# Patient Record
Sex: Female | Born: 1945 | Race: White | Hispanic: No | Smoking: Former smoker
Health system: Southern US, Community
[De-identification: ages and names within clinical notes are randomized; demographics above are authoritative.]

## PROBLEM LIST (undated history)

## (undated) ENCOUNTER — Emergency Department (HOSPITAL_COMMUNITY): Payer: PPO

## (undated) DIAGNOSIS — M1990 Unspecified osteoarthritis, unspecified site: Secondary | ICD-10-CM

## (undated) DIAGNOSIS — E039 Hypothyroidism, unspecified: Secondary | ICD-10-CM

## (undated) DIAGNOSIS — N890 Mild vaginal dysplasia: Secondary | ICD-10-CM

## (undated) DIAGNOSIS — IMO0002 Reserved for concepts with insufficient information to code with codable children: Secondary | ICD-10-CM

## (undated) DIAGNOSIS — F32A Depression, unspecified: Secondary | ICD-10-CM

## (undated) DIAGNOSIS — N8110 Cystocele, unspecified: Secondary | ICD-10-CM

## (undated) DIAGNOSIS — F319 Bipolar disorder, unspecified: Secondary | ICD-10-CM

## (undated) DIAGNOSIS — F419 Anxiety disorder, unspecified: Secondary | ICD-10-CM

## (undated) DIAGNOSIS — M419 Scoliosis, unspecified: Secondary | ICD-10-CM

## (undated) DIAGNOSIS — E785 Hyperlipidemia, unspecified: Secondary | ICD-10-CM

## (undated) DIAGNOSIS — N938 Other specified abnormal uterine and vaginal bleeding: Secondary | ICD-10-CM

## (undated) DIAGNOSIS — F329 Major depressive disorder, single episode, unspecified: Secondary | ICD-10-CM

## (undated) HISTORY — DX: Scoliosis, unspecified: M41.9

## (undated) HISTORY — PX: LASER ABLATION OF THE CERVIX: SHX1949

## (undated) HISTORY — DX: Reserved for concepts with insufficient information to code with codable children: IMO0002

## (undated) HISTORY — DX: Bipolar disorder, unspecified: F31.9

## (undated) HISTORY — PX: COLPOSCOPY: SHX161

## (undated) HISTORY — PX: ANTERIOR AND POSTERIOR VAGINAL REPAIR: SUR5

## (undated) HISTORY — DX: Mild vaginal dysplasia: N89.0

## (undated) HISTORY — DX: Other specified abnormal uterine and vaginal bleeding: N93.8

---

## 1979-01-30 HISTORY — PX: TUBAL LIGATION: SHX77

## 1990-01-29 HISTORY — PX: ABDOMINAL HYSTERECTOMY: SHX81

## 1999-03-23 ENCOUNTER — Inpatient Hospital Stay (HOSPITAL_COMMUNITY): Admission: AD | Admit: 1999-03-23 | Discharge: 1999-03-28 | Payer: Self-pay | Admitting: *Deleted

## 2000-01-15 ENCOUNTER — Inpatient Hospital Stay (HOSPITAL_COMMUNITY): Admission: EM | Admit: 2000-01-15 | Discharge: 2000-01-17 | Payer: Self-pay

## 2000-01-15 ENCOUNTER — Encounter: Payer: Self-pay | Admitting: Orthopedic Surgery

## 2000-01-15 HISTORY — PX: OTHER SURGICAL HISTORY: SHX169

## 2000-04-09 ENCOUNTER — Encounter: Payer: Self-pay | Admitting: Orthopedic Surgery

## 2000-04-09 ENCOUNTER — Encounter: Admission: RE | Admit: 2000-04-09 | Discharge: 2000-04-09 | Payer: Self-pay | Admitting: Orthopedic Surgery

## 2001-11-19 ENCOUNTER — Ambulatory Visit (HOSPITAL_COMMUNITY): Admission: RE | Admit: 2001-11-19 | Discharge: 2001-11-19 | Payer: Self-pay | Admitting: Internal Medicine

## 2001-11-19 ENCOUNTER — Encounter: Payer: Self-pay | Admitting: Internal Medicine

## 2002-01-28 ENCOUNTER — Other Ambulatory Visit: Admission: RE | Admit: 2002-01-28 | Discharge: 2002-01-28 | Payer: Self-pay | Admitting: Obstetrics and Gynecology

## 2002-05-20 ENCOUNTER — Encounter: Payer: Self-pay | Admitting: Obstetrics and Gynecology

## 2002-05-22 ENCOUNTER — Inpatient Hospital Stay (HOSPITAL_COMMUNITY): Admission: RE | Admit: 2002-05-22 | Discharge: 2002-05-24 | Payer: Self-pay | Admitting: Obstetrics and Gynecology

## 2003-06-20 ENCOUNTER — Emergency Department (HOSPITAL_COMMUNITY): Admission: EM | Admit: 2003-06-20 | Discharge: 2003-06-20 | Payer: Self-pay | Admitting: Emergency Medicine

## 2003-08-25 ENCOUNTER — Other Ambulatory Visit: Admission: RE | Admit: 2003-08-25 | Discharge: 2003-08-25 | Payer: Self-pay | Admitting: Obstetrics and Gynecology

## 2004-11-16 ENCOUNTER — Other Ambulatory Visit: Admission: RE | Admit: 2004-11-16 | Discharge: 2004-11-16 | Payer: Self-pay | Admitting: Obstetrics and Gynecology

## 2006-01-15 ENCOUNTER — Other Ambulatory Visit: Admission: RE | Admit: 2006-01-15 | Discharge: 2006-01-15 | Payer: Self-pay | Admitting: Obstetrics and Gynecology

## 2007-02-13 ENCOUNTER — Other Ambulatory Visit: Admission: RE | Admit: 2007-02-13 | Discharge: 2007-02-13 | Payer: Self-pay | Admitting: Obstetrics and Gynecology

## 2007-02-16 ENCOUNTER — Emergency Department (HOSPITAL_COMMUNITY): Admission: EM | Admit: 2007-02-16 | Discharge: 2007-02-16 | Payer: Self-pay | Admitting: *Deleted

## 2007-02-16 IMAGING — CR DG SHOULDER 2+V*L*
4 series · 4 of 4 positions shown · non-contrast
Comparison: none

CLINICAL DATA: Status post fall, trauma, left shoulder pain.   
 LEFT SHOULDER - 4 VIEW:

[w shoulder ap internal left]
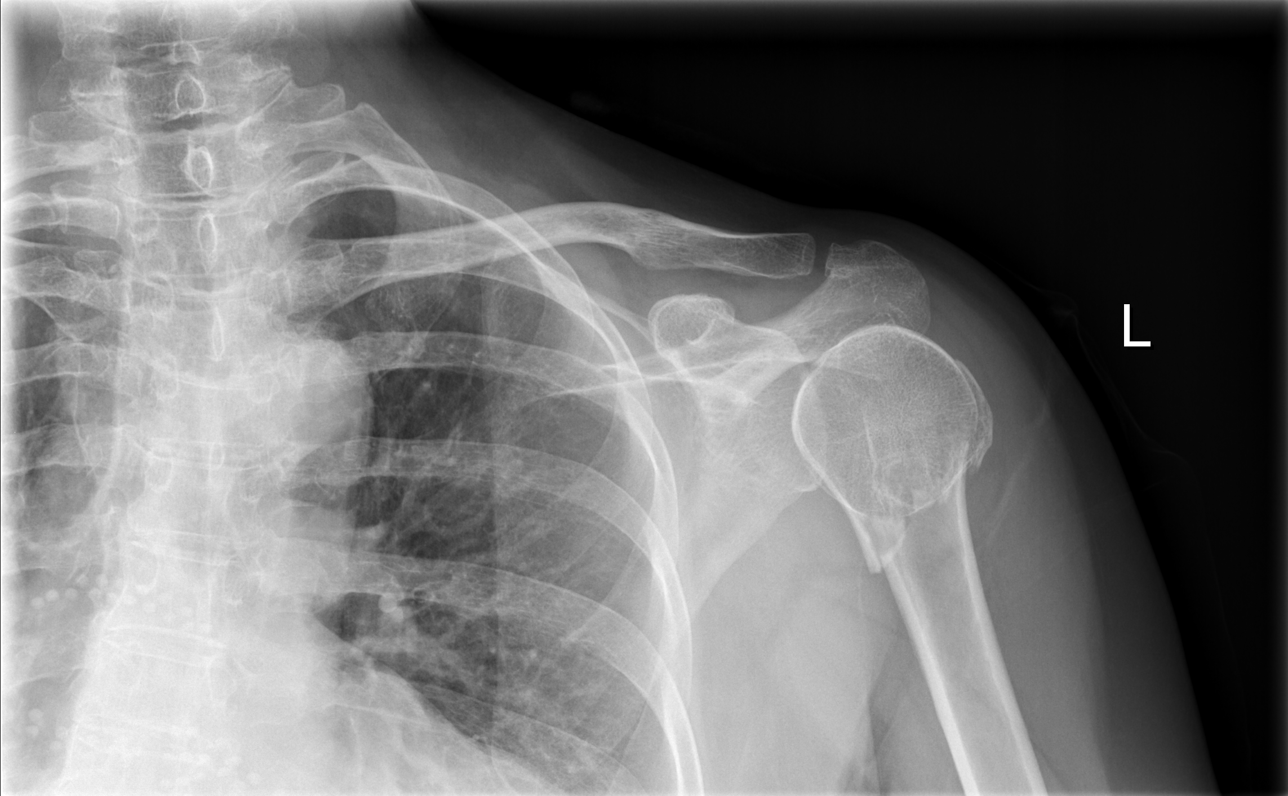

[w shoulder ap external left]
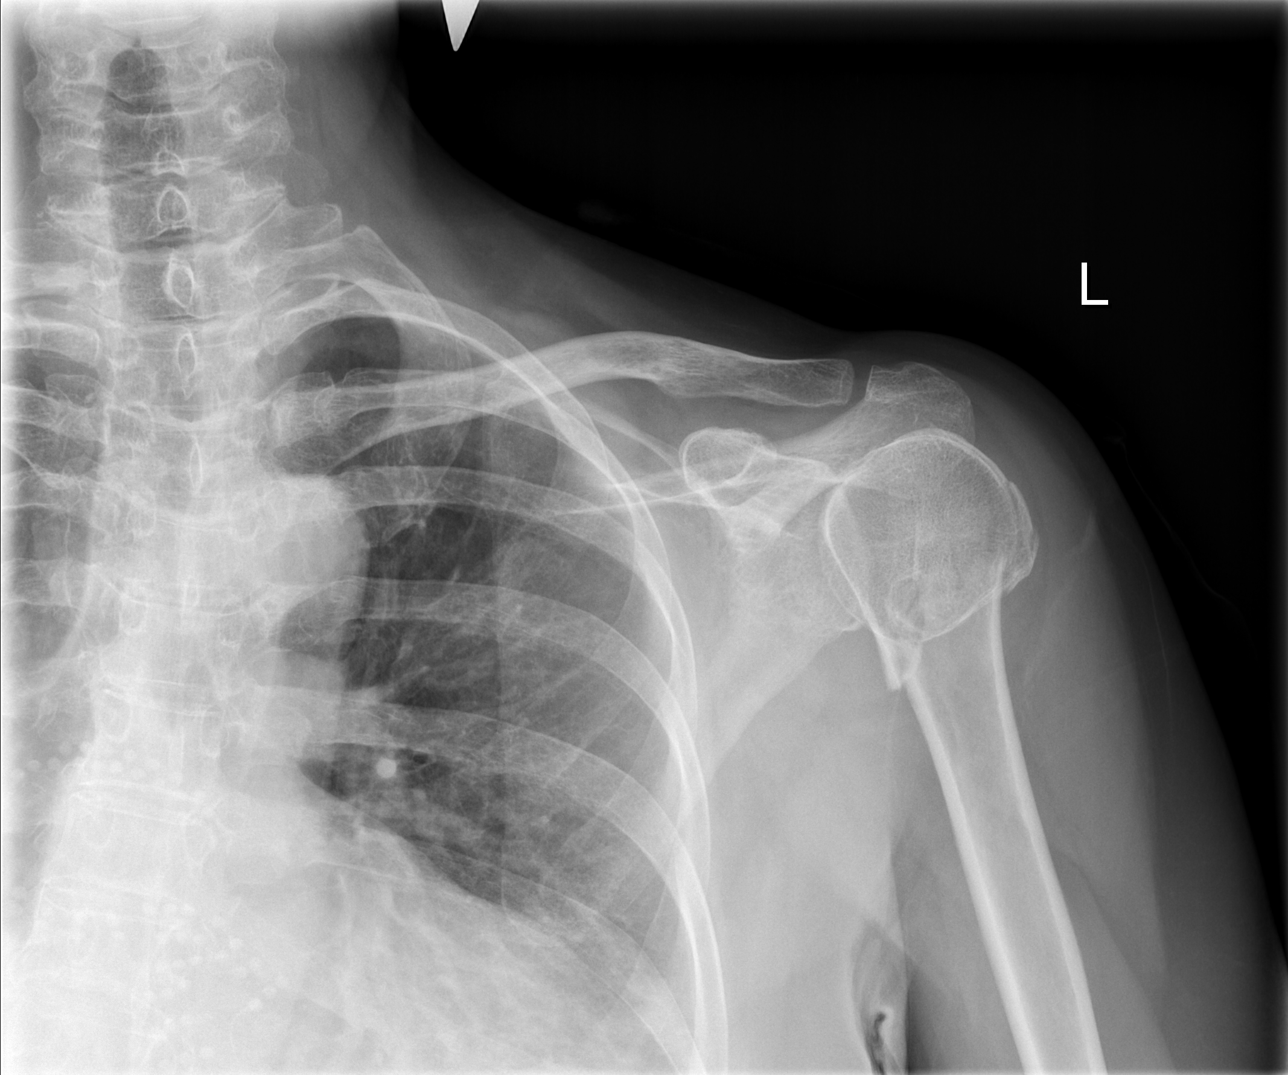

[w shoulder y view left * (1 of 2)]
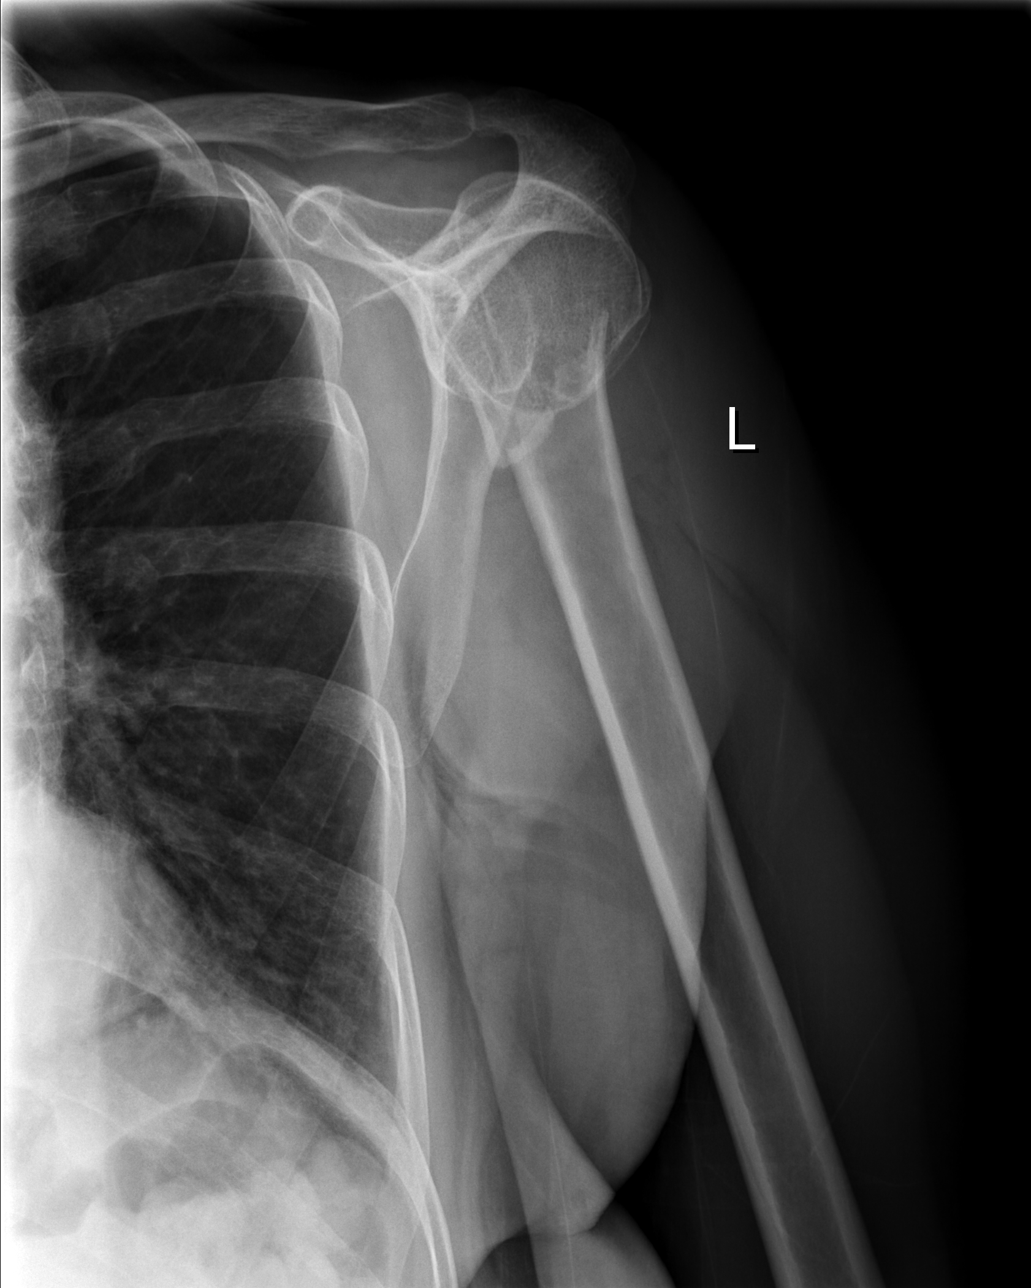

[w shoulder y view left * (2 of 2)]
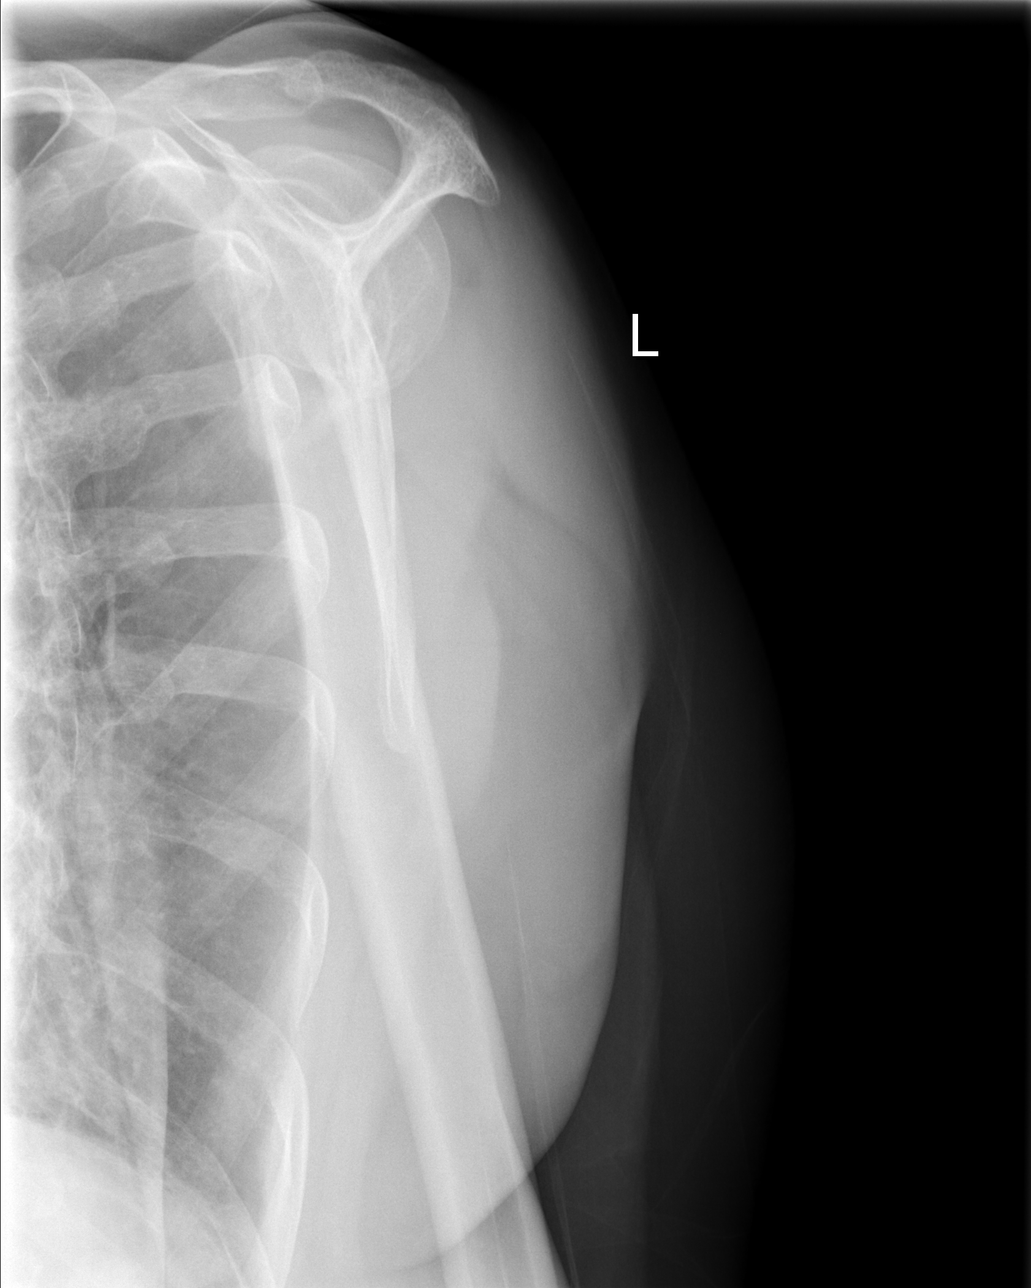

[4 of 4 positions shown; findings below may reference images not displayed]

FINDINGS: Four views of the left shoulder show mild displaced subcapitellar fracture of the left proximal humerus.
IMPRESSION: Mild displaced subcapitellar fracture of the proximal left humerus.

## 2007-04-18 ENCOUNTER — Ambulatory Visit (HOSPITAL_COMMUNITY): Admission: RE | Admit: 2007-04-18 | Discharge: 2007-04-18 | Payer: Self-pay | Admitting: Obstetrics and Gynecology

## 2007-09-09 ENCOUNTER — Ambulatory Visit (HOSPITAL_COMMUNITY): Admission: RE | Admit: 2007-09-09 | Discharge: 2007-09-09 | Payer: Self-pay | Admitting: *Deleted

## 2007-09-09 ENCOUNTER — Encounter (INDEPENDENT_AMBULATORY_CARE_PROVIDER_SITE_OTHER): Payer: Self-pay | Admitting: *Deleted

## 2007-09-09 HISTORY — PX: HEMORRHOID SURGERY: SHX153

## 2007-12-02 ENCOUNTER — Other Ambulatory Visit: Admission: RE | Admit: 2007-12-02 | Discharge: 2007-12-02 | Payer: Self-pay | Admitting: Obstetrics and Gynecology

## 2007-12-02 ENCOUNTER — Encounter: Payer: Self-pay | Admitting: Obstetrics and Gynecology

## 2007-12-02 ENCOUNTER — Ambulatory Visit: Payer: Self-pay | Admitting: Obstetrics and Gynecology

## 2008-02-17 ENCOUNTER — Ambulatory Visit: Payer: Self-pay | Admitting: Obstetrics and Gynecology

## 2008-02-17 ENCOUNTER — Other Ambulatory Visit: Admission: RE | Admit: 2008-02-17 | Discharge: 2008-02-17 | Payer: Self-pay | Admitting: Obstetrics and Gynecology

## 2008-02-17 ENCOUNTER — Encounter: Payer: Self-pay | Admitting: Obstetrics and Gynecology

## 2008-04-08 ENCOUNTER — Ambulatory Visit: Payer: Self-pay | Admitting: Obstetrics and Gynecology

## 2008-04-20 ENCOUNTER — Ambulatory Visit (HOSPITAL_COMMUNITY): Admission: RE | Admit: 2008-04-20 | Discharge: 2008-04-20 | Payer: Self-pay | Admitting: Obstetrics and Gynecology

## 2009-04-01 ENCOUNTER — Other Ambulatory Visit: Admission: RE | Admit: 2009-04-01 | Discharge: 2009-04-01 | Payer: Self-pay | Admitting: Obstetrics and Gynecology

## 2009-04-01 ENCOUNTER — Ambulatory Visit: Payer: Self-pay | Admitting: Obstetrics and Gynecology

## 2009-04-12 ENCOUNTER — Ambulatory Visit: Payer: Self-pay | Admitting: Obstetrics and Gynecology

## 2009-04-20 ENCOUNTER — Ambulatory Visit (HOSPITAL_COMMUNITY): Admission: RE | Admit: 2009-04-20 | Discharge: 2009-04-20 | Payer: Self-pay | Admitting: Obstetrics and Gynecology

## 2010-04-03 ENCOUNTER — Encounter: Payer: Self-pay | Admitting: Obstetrics and Gynecology

## 2010-06-13 NOTE — Op Note (Signed)
NAMECHRISTIONA, [name]              ACCOUNT NO.:  [id number]   MEDICAL RECORD NO.:  [id number]          PATIENT TYPE:  AMB   LOCATION:  DAY                          FACILITY:  [facility]   PHYSICIAN:  Alfonse Ras, MD   DATE OF BIRTH:  [birth year]   DATE OF PROCEDURE:  09/09/2007  DATE OF DISCHARGE:                               OPERATIVE REPORT   PREOPERATIVE DIAGNOSIS:  Grade II and III internal hemorrhoids.   POSTOPERATIVE DIAGNOSIS:  Grade II and III internal hemorrhoids.   PROCEDURE:  PPH hemorrhoidectomy, rectopexy.   ANESTHESIA:  General.   DESCRIPTION:  The patient was taken to the operating room, placed in the  supine position.  After adequate general anesthesia was induced using  endotracheal tube, placed in the prone jack-knife position.  Digital  dilatation was accomplished to 3 fingerbreadths in the anorectum.  The 3  hemorrhoidal bundles were injected with 0.5 Marcaine with Wydase.  This  was massaged in place.  A pursestring 2-0 Prolene suture was placed 5 cm  proximal to the dentate line involving the mucosa and submucosa  circumferentially.  Stapler was introduced, closed after the pursestring  suture was tied around the anvil.  The vagina was checked.  It was held  in place for 45 seconds and fired.  Staple line was inspected.  It was  exactly 2.5 cm from the dentate line, I had a good rim of hemorrhoidal  tissue.  There was no bleeding.  Gelfoam packing was placed.  Sterile  dressing was applied.  The patient tolerated the procedure well and went  to PACU in good condition.      Alfonse Ras, MD  Electronically Signed     KRE/MEDQ  D:  09/09/2007  T:  09/10/2007  Job:  [id number]

## 2010-06-16 NOTE — Op Note (Signed)
[name], [name]                          ACCOUNT NO.:  [id number]   MEDICAL RECORD NO.:  [id number]                   PATIENT TYPE:  INP   LOCATION:  X006                                 FACILITY:  [facility]   PHYSICIAN:  Daniel L. Eda Paschal, M.D.           DATE OF BIRTH:  1945-07-23   DATE OF PROCEDURE:  05/22/2002  DATE OF DISCHARGE:                                 OPERATIVE REPORT   PREOPERATIVE DIAGNOSIS:  Cystocele, rectocele, urinary stress incontinence.   POSTOPERATIVE DIAGNOSIS:  Cystocele, rectocele, urinary stress incontinence.   OPERATION:  Anterior and posterior repair, SPARC sling.   SURGEON:  Daniel L. Eda Paschal, M.D. and Maretta Bees. Vonita Moss, M.D.   FIRST ASSISTANT:  Timothy P. Fontaine, M.D.   ANESTHESIA:  General endotracheal.   FINDINGS:  At the time of surgery, the patient had a third degree cystocele  with complete loss of the urethrovesical angle. There was no enterocele  present. There was, however, a 1 to 1 1/2 degree rectocele.   DESCRIPTION OF PROCEDURE:  After adequate general endotracheal anesthesia,  the patient was placed in the dorsal lithotomy position, prepped and draped  in the usual sterile manner. The vaginal cuff was identified, an inverted T  incision was made. The perivesical fascia was sharply dissected free from  the anterior vaginal mucosa and this was done in such a fashion that the  cystocele was completely free now and was completely off the anterior  mucosa. At this point, Dr. Vonita Moss came and did a [facility] sling which was  dictated in a separate operative note. After he was finished, Dr. Eda Paschal  rescrubbed in and he closed the anterior wall by picking up excellent  vesical fascia and approximately 10 sutures completely reduced the cystocele  and brought the fascia together in the midline. The suture material was used  was 2-0 Vicryl. Redundant vaginal mucosa was trimmed away and then the  anterior vaginal wall was closed with a  running 2-0 Vicryl also picking up  the fascia beneath it for additional support and reduction of dead space.  Once this had been finished, attention was next turned to the posterior  repair starting at the mucocutaneous junction as it was a good perineal body  already. The posterior vaginal mucosa was undermined to the top of the cuff.  The perirectal fascia was sharply dissected free, the rectocele could be  identified and was eliminated with interrupted's of 2-0 Vicryl.  Approximately eight sutures were needed to do this. Transverse perinei  muscles were brought together in the midline, redundant vaginal mucosa was  trimmed away and the posterior vaginal mucosa was closed with a running 2-0  Vicryl once again picking up the fascia beneath it for good support and  elimination of dead space. The vagina was packed with 1 inch iodoform pack.  The patient tolerated the procedure well and left the operating room in  satisfactory condition. Estimated blood loss  for the entire procedure was  200 mL with none replaced.                                                Daniel L. Eda Paschal, M.D.    Tonette Bihari  D:  05/22/2002  T:  05/22/2002  Job:  [id number]

## 2010-06-16 NOTE — Op Note (Signed)
[name], [name]                          ACCOUNT NO.:  [id number]   MEDICAL RECORD NO.:  [id number]                   PATIENT TYPE:  INP   LOCATION:  X006                                 FACILITY[facility]   PHYSICIAN:  Maretta Bees. Vonita Moss, M.D.             DATE OF BIRTH:  July 06, 1945   DATE OF PROCEDURE:  05/22/2002  DATE OF DISCHARGE:                                 OPERATIVE REPORT   PREOPERATIVE DIAGNOSIS:  Stress incontinence.   POSTOPERATIVE DIAGNOSIS:  Stress incontinence.   PROCEDURE:  SPARC sling system insertion.   SURGEON:  Maretta Bees. Vonita Moss, M.D.   ANESTHESIA:  General.   INDICATIONS:  This 65 year old lady has had a cystocele, bladder pressure,  pain and also some stress incontinence.  Daniel L. Gottsegen, M.D. is  performing an anterior and posterior repair and, in conjunction with that, I  will place a SPARC sling to control her stress incontinence.   DESCRIPTION OF PROCEDURE:  The patient was brought to the operating room and  Dr. Eda Paschal proceeded with dissection out of the bladder for his cystocele  repair.  He scrubbed out, and I scrubbed in the case and with the Foley  catheter in place, the urethra was easily identified, and I performed  puncture wounds suprapubically just lateral to the midline on each side,  through which a SPARC system needle was placed down on top of the symphysis  pubis and marched back down the back of it and brought out on each side  through the endopelvic fascia periurethrally.  I then cystoscoped her, and  there was no evidence of needles injuring or retreating into the bladder.  The sling material was snapped on each needle and brought up bilaterally and  out through the rectus fascia and skin.  I then re-cystoscoped her, and  there was no sling material noted in the bladder.  I then positioned the  sling so that there was a hemostat between it and the urethra with no undue  tension.  The sling was noted to be in good  position in mid urethra.  This  was with the Foley catheter back in position.  The excess sling material was  cut off at the skin level, and it retracted subcutaneously.  At this point,  with the sling positioned in good condition, Dr. Eda Paschal scrubbed back in  the case and completed his surgery dictated separately.                                               Maretta Bees. Vonita Moss, M.D.    LJP/MEDQ  D:  05/22/2002  T:  05/22/2002  Job:  [id number]   cc:   Reuel Boom L. Eda Paschal, M.D.  [address], [address]  [place]  [place] [address]  Fax:  [phone]  

## 2010-06-16 NOTE — Op Note (Signed)
[place]. [facility]  Patient:    Ruth, Ellison                       MRN: [id number] Proc. Date: 01/15/00 Adm. Date:  [id number] Attending:  Twana First                           Operative Report  PREOPERATIVE DIAGNOSIS:  Left hip femoral neck fracture.  POSTOPERATIVE DIAGNOSIS:  Left hip femoral neck fracture.  PROCEDURE:  Closed reduction and pining of left hip femoral neck fracture with Ace cannulated 6.5 mm screws x 3.  SURGEON:  Elana Alm. Thurston Hole, M.D.  ASSISTANT:  Kirstin A. Shepperson, P.A.  ANESTHESIA:  General.  OPERATIVE TIME:  40 minutes.  COMPLICATIONS:  None.  DESCRIPTION OF PROCEDURE:  [name] was brought to the operating room on January 15, 2000, placed under general anesthesia on her own bed, and then carefully transferred to the fracture table.  Her right leg was placed in overhead suspension and padded well and her left leg placed in direct longitudinal traction.  AP and lateral fluoroscopic x-rays confirmed with the leg internally rotated so that the knee was pointing straight up that the hip reduced into an anatomic position.  Her left hip was prepped using sterile Betadine and draped using sterile technique in this position and Ancef 1 g IV was given preoperatively for prophylaxis.  Initially, through a 3 cm incision, three separate guide pins from the cannulated 6.5 mm screw system were placed in the center of the femoral head and neck under fluoroscopic control.  Each of these were measured.  Two 85 mm screws and one 90 mm screw were then placed over the guide pins and drilled into position with an excellent fixation, holding the fracture in a reduced and anatomic position.  After this was done, AP and lateral fluoroscopic x-rays confirmed anatomic reduction of the fracture and satisfactory position of the hardware.  The wound was then irrigated and closed using 0 Vicryl, 2-0 Vicryl, and staples.  The wound  was injected with 0.25% Marcaine.  Sterile dressings were applied.  The patient was taken out of traction, transferred to hospital bed, and awakened and taken to the recovery room in stable condition.  Needle and sponge counts correct x 2 at the end of the case. DD:  01/15/00 TD:  01/16/00 Job: [id number]

## 2010-06-16 NOTE — Discharge Summary (Signed)
[place]. [facility]  Patient:    [name], [name]                       MRN: [id number] Adm. Date:  [id number] Disc. Date: [id number] Attending:  Armanda Heritage Dictator:   Julien Girt, P.A.-C.                 Discharge Summary  ADMITTING DIAGNOSES: 1. Left hip femoral neck fracture. 2. Bipolar disease.  DISCHARGE DIAGNOSES: 1. Left hip femoral neck fracture, status post percutaneous pinning. 2. Bipolar disease.  HISTORY OF PRESENT ILLNESS:  The patient is a 65 year old female who fell on January 15, 2000, while playing with her dog.  She presented to [facility] complainingof left hip pain.  She had no loss of consciousness, no pain except in her left hip.  Left leg was neurovascularly intact.  X-rays on admission showed a left hip femoral neck fracture.  EKG showed normal sinus rhythm with right superior axis deviation, pulmonary disease pattern, incomplete right bundle branch block.  Medicine was consulted for medical clearance. She was seen by Dr. Selina Cooley, who cleared her from a cardiologicstandpoint to undergo surgery.  PROCEDURES IN-HOUSE:  On December 17,2001, the patient underwent closed reduction and percutaneous pinning ofher left hip fracture.  POSTOPERATIVE HOSPITAL COURSE:  The patient postoperatively did well.  Her labs preoperatively were a hemoglobin of 14.4, white cell count 10.3, INR 0.9, and her sodium was 135, potassium 4.2, chloride 101, CO2 20, glucose 80, BUN 15, creatinine 0.6, calcium 9.6.Postop day one, the patient progressed well. She was up out of bed.  Foley was discontinued.  Hemoglobin was 12.7, white cell count was 7.7.  Sodium was 135, potassium 4.2, chloride 103, CO2 28, glucose 109, BUN 7, creatinine 0.4, calcium 8.4.  Postop day two, the patient continued to do very well with physical therapy.  She was discharged to home in stable condition.  She was once again reminded to be touchdown weightbearing  with a walker.  Home health physical therapy was ordered to work with her on ambulation on touchdown weightbearing.  She will follow-up with Dr. Thurston Hole in one week in the office for x-rays and suture removal. DD:  02/09/00 TD:  02/13/00 Job: [id number]

## 2010-06-16 NOTE — Discharge Summary (Signed)
   [name], Ellison                          ACCOUNT NO.:  [id number]   MEDICAL RECORD NO.:  [id number]                   PATIENT TYPE:  INP   LOCATION:  0440                                 FACILITY:  [facility]   PHYSICIAN:  Daniel L. Eda Paschal, M.D.           DATE OF BIRTH:  Sep 23, 1945   DATE OF ADMISSION:  05/22/2002  DATE OF DISCHARGE:  05/24/2002                                 DISCHARGE SUMMARY   HISTORY OF PRESENT ILLNESS:  The patient is a 65 year old female who had  presented to my office with cystocele, rectocele, and urinary stress  incontinence.  As a result of this, she was taken to the operating room on  May 22, 2002 and anterior and posterior repair was done for the cystocele  and rectocele by Reuel Boom L. Gottsegen, M.D. and a Spark sling procedure was  done by Maretta Bees. Vonita Moss, M.D.  Postoperatively, she was admitted for both  pain relief and bladder attention.   HOSPITAL COURSE:  On the first postoperatively day, her pack was taken out,  her diet was advanced.  On the second postop day, her Foley was removed.  However, she could not void.  At this time, it was felt that she could be  discharged with her Foley in place.  She was going to leave it in an  additional 48 hours and then remove it and then would let Dr. Enos Fling  office know if she was unable to void at that point.   DISCHARGE MEDICATIONS:  1. Cipro 500 mg b.i.d. x7 days to cover her Foley.  2. Tylox for pain relief.   DIET:  Diet on discharge was regular.   ACTIVITY:  Activity on discharge was ambulatory.   CONDITION ON DISCHARGE:  Improved.   DISCHARGE DIAGNOSES:  Cystocele, rectocele, urinary stress incontinence.   OPERATIONS:  Anterior and posterior repair, Spark procedure.                                               Daniel L. Eda Paschal, M.D.    Tonette Bihari  D:  06/06/2002  T:  06/06/2002  Job:  [id number]

## 2010-06-16 NOTE — H&P (Signed)
[name], [name]                          ACCOUNT NO.:  [id number]   MEDICAL RECORD NO.:  [id number]                   PATIENT TYPE:  INP   LOCATION:  NA                                   FACILITY[facility]   PHYSICIAN:  Daniel L. Eda Paschal, M.D.           DATE OF BIRTH:  1945-04-19   DATE OF ADMISSION:  DATE OF DISCHARGE:                                HISTORY & PHYSICAL   CHIEF COMPLAINT:  Symptomatic cystocele with USI.   HISTORY OF PRESENT ILLNESS:  The patient is a 65 year old gravida 2, para 2,  AB 0, who presented to the office in 12/03, with a feeling that her bladder  had completely dropped.  She finds that it sticks out.  She has trouble  doing any kind of physical activity as a result of that.  She is also having  some urinary stress incontinence as a result of the above.  She has some  urgency with urination.  She does not have any real urinary incontinence.  She has no referable rectocele symptoms, although on examination she did  have a rectocele.  She was assessed for the above by Dr. Larey Dresser, who  felt that a sling procedure along with an anterior posterior repair done by  me would be helpful.  He now enters the hospital for the above.   PAST MEDICAL HISTORY:  1. The patient had a tubal ligation in 1981.  2. Total abdominal hysterectomy with her ovaries left in place along with     appendectomy in 1992, for endometriosis and fibroids.  3. Broken hip in 2001, and had half the screws removed in 2004.   MEDICATIONS:  1. Depakote ER 500 mg tablets one at bedtime for sleep.  2. Alprazolam 0.5 mg tablets one b.i.d.  3. Trazodone 50 mg one or two at bedtime.  4. Prozac 40 mg one q.a.m.  5. Neurontin 400 mg capsules one b.i.d.   ALLERGIES:  DARVOCET.   FAMILY HISTORY:  Grandmother is hypertensive.  Grandfather had coronary  artery disease.  Her mother has had breast cancer.   SOCIAL HISTORY:  She drinks alcohol infrequently and she does not use   tobacco.   REVIEW OF SYMPTOMS:  HEENT:  Negative.  CARDIAC:  History of palpitations.  RESPIRATORY:  Negative.  GASTROINTESTINAL:  Hemorrhoids.  MUSCULOSKELETAL:  Pain and swelling, history of a fractured hip.  GENITOURINARY:  See above.  NEUROLOGIC/PSYCHIATRIC:  History of bipolar disease and anxiety disorder.  ALLERGIC:  Negative.  IMMUNOLOGICAL:  Negative.  ENDOCRINE:  Negative except  for the use of Darvocet.   PHYSICAL EXAMINATION:  GENERAL:  The patient is a well-developed, well-  nourished female in no acute distress.  VITAL SIGNS:  Blood pressure is 116/74, pulse is 80 and regular,  respirations 16 and nonlabored, she is afebrile.  HEENT:  Within normal limits.  NECK:  Supple, trachea in the midline.  Thyroid is not  enlarged.  LUNGS:  Clear to auscultation and percussion.  HEART:  No thrills, heaves, or murmurs.  BREASTS:  No masses.  ABDOMEN:  Soft without guarding, rebound, or masses.  PELVIC:  External genitalia is normal.  BUS is normal.  Bladder shows a  third degree cystocele.  Vagina shows a small rectocele.  Cervix and uterus  are absent.  Adnexa fails to reveal masses.  Anus and perineum is normal.  Digital rectal examination is negative.   IMPRESSION:  1. Large cystocele.  2. Urinary stress incontinence.  3. Small rectocele.   PLAN:  Anterior and posterior repair in association with a sling procedure  done by Dr. Vonita Moss.                                                Daniel L. Eda Paschal, M.D.    Tonette Bihari  D:  05/21/2002  T:  05/21/2002  Job:  [id number]

## 2010-09-06 ENCOUNTER — Other Ambulatory Visit: Payer: Self-pay | Admitting: Obstetrics and Gynecology

## 2010-10-27 LAB — HEMOGLOBIN AND HEMATOCRIT, BLOOD
HCT: 41.4
Hemoglobin: 13.5

## 2011-02-20 ENCOUNTER — Encounter: Payer: Self-pay | Admitting: Gynecology

## 2011-02-20 DIAGNOSIS — N890 Mild vaginal dysplasia: Secondary | ICD-10-CM | POA: Insufficient documentation

## 2011-02-20 DIAGNOSIS — M419 Scoliosis, unspecified: Secondary | ICD-10-CM | POA: Insufficient documentation

## 2011-02-20 DIAGNOSIS — IMO0002 Reserved for concepts with insufficient information to code with codable children: Secondary | ICD-10-CM | POA: Insufficient documentation

## 2011-03-15 ENCOUNTER — Ambulatory Visit (INDEPENDENT_AMBULATORY_CARE_PROVIDER_SITE_OTHER): Payer: Medicare Other | Admitting: Obstetrics and Gynecology

## 2011-03-15 ENCOUNTER — Encounter: Payer: Self-pay | Admitting: Obstetrics and Gynecology

## 2011-03-15 VITALS — BP 124/84 | Ht 62.25 in | Wt 155.0 lb

## 2011-03-15 DIAGNOSIS — M810 Age-related osteoporosis without current pathological fracture: Secondary | ICD-10-CM

## 2011-03-15 DIAGNOSIS — N898 Other specified noninflammatory disorders of vagina: Secondary | ICD-10-CM

## 2011-03-15 DIAGNOSIS — F319 Bipolar disorder, unspecified: Secondary | ICD-10-CM | POA: Insufficient documentation

## 2011-03-15 DIAGNOSIS — N8111 Cystocele, midline: Secondary | ICD-10-CM

## 2011-03-15 DIAGNOSIS — N893 Dysplasia of vagina, unspecified: Secondary | ICD-10-CM

## 2011-03-15 DIAGNOSIS — E7800 Pure hypercholesterolemia, unspecified: Secondary | ICD-10-CM | POA: Insufficient documentation

## 2011-03-15 DIAGNOSIS — IMO0002 Reserved for concepts with insufficient information to code with codable children: Secondary | ICD-10-CM

## 2011-03-15 DIAGNOSIS — N952 Postmenopausal atrophic vaginitis: Secondary | ICD-10-CM

## 2011-03-15 NOTE — Progress Notes (Signed)
Patient came back to see me today for further followup. We've been watching her with a cystocele. She is aware of it. She will occasionally get nocturia. She is not having frequency, urgency or incontinence. We have treated her with Estrace cream which she thinks is helped but is causing intermittent itching vaginally. She is due for her yearly mammogram. We have also treated her for osteoporosis with IV Reclast. She tolerates the IV Reclast well. She has had 3 injections by her last was 2 years ago. Her last bone density was in April 2012 and she now has worse T score of -2.3 was statistically significant increase in bone density of the femoral neck. She remains on calcium and vitamin D. She's had no fractures. She is not sexually active. She is having no vaginal bleeding. She is having no pelvic pain. 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  ROS: see  Pertinent positives above. Other positives include hyperlipidemia, scoliosis, and bipolar disorder. 12 system review done  HEENT: Within normal limits. Kennon Portela present. Neck: No masses. Supraclavicular lymph nodes: Not enlarged. Breasts: Examined in both sitting and lying position. Symmetrical without skin changes or masses. Abdomen: Soft no masses guarding or rebound. No hernias. Pelvic: External within normal limits. BUS within normal limits. Vaginal examination shows good estrogen effect,  Cystocele smaller today and only half-degree Cervix and uterus absent. Adnexa within normal limits. Rectovaginal confirmatory. Extremities within normal limits.  Assessment: #1. Cystocele #2. Atrophic vaginitis #3. Osteoporosis improved #4. VAIN  Plan: Discussed pros and cons of continuing Reclast. She has had a previous hip fracture. We have elected to give her fourth dose and will get her approved. Switched her from esterase cream to Premarin cream because of itching. Mammogram.

## 2011-03-27 ENCOUNTER — Telehealth: Payer: Self-pay | Admitting: *Deleted

## 2011-03-27 MED ORDER — ESTROGENS, CONJUGATED 0.625 MG/GM VA CREA: TOPICAL_CREAM | Freq: Every day | VAGINAL | Status: DC

## 2011-03-27 NOTE — Telephone Encounter (Signed)
rx sent to pharmacy, pt wanted 1rx sent to [facility] and 12 refills sent to mail order pharmacy.

## 2011-03-27 NOTE — Telephone Encounter (Signed)
Pt called to follow up and let you know that her premarin cream is working great! Okay to send Rx to pharmacy?

## 2011-03-27 NOTE — Telephone Encounter (Signed)
yes

## 2011-04-02 ENCOUNTER — Telehealth: Payer: Self-pay | Admitting: *Deleted

## 2011-04-02 NOTE — Telephone Encounter (Signed)
Had lm for patient to call about Reclast.  Patient's cost is going to be approx $200.  She had been informed and was supposed to get labs done through her PCP and then let us know when she would be able to receive Reclast.

## 2011-04-02 NOTE — Telephone Encounter (Signed)
Message copied by Libby Maw on Mon Apr 02, 2011  3:06 PM ------      Message from: Trellis Paganini      Created: Thu Mar 15, 2011  1:55 PM       Please get patient approved for Reclast. She has previously been on it  for osteoporosis. It is time for her next dose.

## 2011-04-04 NOTE — Telephone Encounter (Signed)
Patient said she did have labs done.  We are waiting for results to be sent to our office

## 2011-04-12 NOTE — Telephone Encounter (Signed)
Had lm for patient on 04/09/11 to send labs, we still have not received labs from PCP.

## 2011-04-17 NOTE — Telephone Encounter (Signed)
Patient is set up for reclast on 04/19/11 @1pm .  Gave patient all instructions on the phone. Will be billed approx $200.

## 2011-04-18 ENCOUNTER — Other Ambulatory Visit (HOSPITAL_COMMUNITY): Payer: Self-pay | Admitting: *Deleted

## 2011-04-19 ENCOUNTER — Encounter (HOSPITAL_COMMUNITY)
Admission: RE | Admit: 2011-04-19 | Discharge: 2011-04-19 | Disposition: A | Discharge status: Home / Self Care | Payer: Medicare Other | Source: Ambulatory Visit | Attending: Obstetrics and Gynecology | Admitting: Obstetrics and Gynecology

## 2011-04-19 DIAGNOSIS — M810 Age-related osteoporosis without current pathological fracture: Secondary | ICD-10-CM | POA: Insufficient documentation

## 2011-04-19 MED ORDER — ZOLEDRONIC ACID 5 MG/100ML IV SOLN
5.0000 mg | Freq: Once | INTRAVENOUS | Status: AC
  Administered 2011-04-19: 5 mg via INTRAVENOUS
  Filled 2011-04-19: qty 100

## 2011-05-03 ENCOUNTER — Ambulatory Visit: Payer: Medicare Other | Admitting: Obstetrics and Gynecology

## 2011-05-09 ENCOUNTER — Telehealth: Payer: Self-pay | Admitting: *Deleted

## 2011-05-09 NOTE — Telephone Encounter (Signed)
PT CALLED STATING THE DR. PETERSON HAS RETIRED AND WOULD LIKE TO KNOW A NAME OF ANOTHER DOCTOR. LEFT MESSAGE ON PT VM THAT ANY DOCTOR AT [facility] WOULD BE GOOD.

## 2011-05-18 ENCOUNTER — Other Ambulatory Visit: Payer: Self-pay | Admitting: *Deleted

## 2011-05-18 DIAGNOSIS — N6009 Solitary cyst of unspecified breast: Secondary | ICD-10-CM

## 2011-05-23 ENCOUNTER — Encounter: Payer: Self-pay | Admitting: Obstetrics and Gynecology

## 2011-06-14 ENCOUNTER — Ambulatory Visit: Payer: Medicare Other | Admitting: Obstetrics and Gynecology

## 2011-06-14 ENCOUNTER — Ambulatory Visit (INDEPENDENT_AMBULATORY_CARE_PROVIDER_SITE_OTHER): Payer: Medicare Other | Admitting: Obstetrics and Gynecology

## 2011-06-14 DIAGNOSIS — IMO0002 Reserved for concepts with insufficient information to code with codable children: Secondary | ICD-10-CM

## 2011-06-14 DIAGNOSIS — N8111 Cystocele, midline: Secondary | ICD-10-CM

## 2011-06-14 DIAGNOSIS — N993 Prolapse of vaginal vault after hysterectomy: Secondary | ICD-10-CM

## 2011-06-14 NOTE — Progress Notes (Signed)
Patient came to see me today to have a pessary fit. Patient is symptomatic from a cystocele with vault prolapse and is going to have surgery by Dr. Patsi Sears. The surgery could not be done for about 6 more weeks and because patient symptoms have increased dramatically patient cannot tolerate the prolapse. Dr. Patsi Sears sent her over to be fitted for a pessary.  Exam: Kennon Portela present. External: Within normal limits. BUS: Within normal limits. Vaginal examination: Third degree cystocele with vault prolapse.  Assessment: Cystocele with vaginal vault prolapse  Plan: Patient fit with 2-3/4 inch Gellhorn pessary. It is a tight fit but we had the patient stay for a while and she think she can tolerate it. If not she will return and we will try a 2-1/2 inch Gellhorn pessary.

## 2011-06-19 ENCOUNTER — Ambulatory Visit: Payer: Medicare Other | Admitting: Obstetrics and Gynecology

## 2011-09-03 ENCOUNTER — Other Ambulatory Visit: Payer: Self-pay | Admitting: Urology

## 2011-09-07 ENCOUNTER — Encounter (HOSPITAL_BASED_OUTPATIENT_CLINIC_OR_DEPARTMENT_OTHER): Payer: Self-pay | Admitting: *Deleted

## 2011-09-10 ENCOUNTER — Encounter (HOSPITAL_BASED_OUTPATIENT_CLINIC_OR_DEPARTMENT_OTHER): Payer: Self-pay | Admitting: *Deleted

## 2011-09-10 NOTE — Progress Notes (Signed)
NPO AFTER MN. ARRIVES AT 0800. NEEDS HG AND EKG. WILL TAKE XANAX, ZOCOR, SYNTHROID, CELEXA, AND DO FLONASE SPRAY AM OF SURG W/ SIPS OF WATER.

## 2011-09-12 NOTE — H&P (Signed)
ctive Problems Problems  1. 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  Hx of anterior vault prolapse, urinary hesitancy, weak flow, and hesitancy. Post SPARC 2004. She now notes vaginal prolapse outside the vagina for 2 months, using premarin cream. No ulceration. Denies constipation. Post hysterectonmy age 41. Not sexually active.   Video urodynamics was accomplished on 07/17/11, in the sitting position. The maximum capacity is 1320 cc. The bladder is hyposensitive, with first sensation occurring at 700 cc, normal desire at 1064 cc, strong desire at 1133 cc. The bladder stable. The patient did not leak for maximum abdominal leak point pressure at 165 cm to water. The patient did not leak with or without her prolapse reduced.  The voided via mist 331 cc with maximum flow rate of 25 cc per second. Maximum detrusor pressure is 12 cm to water and PVR is  89 cc. She sometimes needs to tilt her bladder in order to empty.  The patient appears to have a very large capacity, hyposensitive but stable bladder. There is no stress incontinence with or without the cystocele reduced. There is loss of bladder compliance and a large volumes, and there may be a large diverticulum on fluoroscopic examination. The patient double voids in the privacy of her home.   Past Medical History Problems  1. History of  Anxiety (Symptom) 300.00 2. History of  Arthritis V13.4 3. History of  Asthma 493.90 4. History of  Depression 311 5. History of  Hypercholesterolemia 272.0 6. History of  Hypothyroidism 244.9  Surgical History Problems  1. History of  Hemorrhoidectomy 2. History of  Hip Surgery 3. History of  Hysterectomy V45.77 4. History of  Vaginal Sling Operation For Stress Incontinence  Current Meds 1. ALPRAZolam TABS; Therapy: (Recorded:14May2013) to 2. Aspirin TABS; Therapy:  (Recorded:14May2013) to 3. Citalopram Hydrobromide 40 MG Oral Tablet; Therapy: 05Nov2012 to 4. Flexeril TABS; Therapy: (Recorded:14May2013) to 5. Levothyroxine Sodium 88 MCG Oral Tablet; Therapy: 26Feb2013 to 6. Simvastatin 20 MG Oral Tablet; Therapy: 26Feb2013 to 7. Sudafed TABS; Therapy: (Recorded:14May2013) to 8. Wellbutrin SR TBCR; Therapy: (Recorded:30Jul2013) to  Allergies Medication  1. Darvocet-N 100 TABS  Family History Problems  1. Maternal history of  Breast Cancer V16.3 2. Family history of  Family Health Status Number Of Children 2 daughters 3. Family history of  Father Deceased At Age 41 Heart disease 4. Paternal history of  Heart Disease V17.49 5. Family history of  Mother Deceased At Age 17 breast cancer  Social History Problems  1. Alcohol Use 1 2. Caffeine Use 8 per day 3. Former Smoker V15.82 1 ppd x 24yrs 4. Marital History - Divorced V61.03 5. Occupation: Retired  Event organiser, constitutional, skin, eye, otolaryngeal, hematologic/lymphatic, cardiovascular, pulmonary, endocrine, musculoskeletal, gastrointestinal, neurological and psychiatric system(s) were reviewed and pertinent findings if present are noted.  Genitourinary: urinary hesitancy, urinary stream starts and stops and initiating urination requires straining and weak flow.  ENT: sinus problems.  Musculoskeletal: back pain.  Psychiatric: depression and anxiety.    Vitals Vital Signs [Data Includes: Last 1 Day]  30Jul2013 08:30AM  Blood Pressure: 107 / 76 Temperature: 98.4 F Heart Rate: 95  Physical Exam Genitourinary:. Normal vaginal vault levator muscle tone. No pain.   Examination of the external genitalia shows normal female external genitalia, no vulvar atrophy and no lesions. The urethra is normal in appearance and not  tender. There is no urethral mass. Vaginal exam demonstrates no abnormalities, no atrophy, no discharge, no tenderness, the vaginal epithelium to  be well estrogenized and no uterine prolapse. A cystocele is present with a midline defect (grade 3-4 /4). No enterocele is identified. No rectocele is identified. The cervix is is absent. There is no cervical discharge. The uterus is absent, but non tender and not enlarged. The adnexa are palpably normal. The bladder is normal on palpation, non tender and not distended. The anus is normal on inspection. The perineum is normal on inspection.    Assessment Assessed  1. 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 she needs anteriot vault sacrospinus repair. We have reviewed her urodynamics, showing an enlarged bladder, but one which she empties with double voiding technique. She has a stable bladder, and does not leak , even with her Stage II anterior vault prolapse reduced.   We have discussed alternatives for her repair, and possibilities for biologic and mesh repairs. We have discussed mesh lawsuits, and  my own experience with both biologic and mesh repairs. I think she has loss of Level I support, and would best be served with sacrospinus suspensiion, with UPHOLD mesh repair, wht  a 3% chance of mesh extrusion, and 10% chance of de novo urge/urge incintinence. i will include information which I have written for patients, and have discussed with her the UPHOLD post marketing study. she understandsvthat I teach/monitor for [employer].      I have given her my patient handout, indicating that she has been found to have a hernia-or weakness- in the vaginal wall, allowing one or more organs to begin to descend-both by the pull of gravity, and by the push of abdominal straining-into, and occasionally through-the vagina. This anatomic herniation is called Pelvic Organ Prolapse(POP).     She may be found to have:  (1) weakness of the urethra-resulting in  "stress "(cough laugh, or sneeze) urinary incontinence; (2) weakness of the anterior vaginal wall, resulting in a Cystocele; and (3), weakness  of the posterior vaginal wall, resulting in an Enterocele(small intestine herniation  high up through the posterior vaginal wall); or a Rectocele(rectum "herniating" through the posterior vaginal wall).   These  anatomic problems may be directly related to childbirth, neuropathies, diabetes, constipation, cigarette smoking, steroid use, diseases of the connective tissue(eg: lupus, steroids for chronic asthma), or previous pelvic surgery, radiation, or chemotherapy.     The surgical repair which is recommended for you is based on an integration of your symptoms, your medical history, your physical examination; as well as your video urodynamic studies, and laboratory evaluation. In the end, the final anatomic evaluation is performed under anesthesia, and it is for this reason, that the end result operation may be slightly different than the preoperative evaluation might suggest. We are prepared to repair the pelvic floor in any number of ways, which have been explained to you.  Pelvic floor repair was first reported in 1913, with success rates of 50-90%. However, long-term followup of repairs using absorbable tissue has shown 20% to 50% recurrence rates. Therefore, when synthetic mesh was introduced into pelvic floor repairs in 2000, it was quickly incorporated into surgical approaches. However, early evaluation of surgical mesh (no longer in use)(2000 -2005) showed technical flaws which led to erosion  of the mesh through the vaginal incision. This has led to multiple lawsuits with regard to the use of vaginal mesh. Currently, mesh erosion is noted to be a complication of pelvic organ prolapse surgery, as reported in 2-11% of cases.  Evaluation of a single surgeon's experience with the newest vaginal mesh [facility] Scientific mesh, Patsi Sears, January 2009 through August  2011) has shown no erosions from pubovaginal sling surgery, and a 2% extrusion rate from other larger pelvic floor organ repair surgery. These extrusions have been treated by vaginal hormone treatment, and surgical excision of a tiny portion of the extruded mesh. There have been no severe long-term complications noted (up to 4 year follow-up).    There are several approaches for repair for pelvic organ prolapse. Experience has shown that pubovaginal sling for stress urinary incontinence is most efficiently implanted via a transvaginal route. Patients may either have the standard pubovaginal sling via a retropubic approach, transobturator approach, or single-incision mid-urethral sling. The retropubic route probably gives  the most support to the urethra, whereas the single-incision mid-urethral sling presents the least volume of mesh material. Anterior vault suspension is accomplished by placing mesh between the bladder  and vaginal wall, and attaching the mesh to the sacrospinous ligaments. This prevents the anterior vault hernia from recurring. If the hysterectomy is accomplished at the same time, then absorbable tissue is recommended to be used instead of mesh, because of an increased chance of mesh erosion at the incision line. Anterior vault suspension can also be accomplished via the laparoscopic robotic approach. While this approach gives excellent results, it is more expensive, and more invasive. Transvaginal surgery allows the patient to have natural orifice surgical approach ("Notes" approach), which is less invasive, and less expensive, while providing superb results. Recent evaluation (see above) has shown a single surgeon's results with 100 cases of anterior and posterior vault suspension using [employer] mesh. These results show a 2% recurrence rate, 3% extrusion rate, on patients with grade 3-4 prolapse (worst prolapse).  Common questions for patient's regarding pelvic organ prolapse are as  follows:  #1  Is mesh to be used in my surgery?   #2   Am I a good candidate for mesh?  #3 Why is mesh the best option for my repair?  #4 What are the alternatives to transvaginal mesh for repair of my prolapse?  #5 Could my surgery be performed without using mesh? #6 Will my partner be able to feel the surgical mesh during sexual intercourse, and what if the mesh erodes through the vaginal wall?  #7 How often have you ,as a Careers adviser ,implanted this particular product?  #8 What results have your other patients  had with this product?  #9 What can I expect to feel after surgery, and for how long?  #10 Which specific side effects should I report after surgery ? #11 What if the mesh surgery doesn't correct the problem ?        I believe surgical mesh implantation will correct your pelvic organ prolapse problem, with a very low chance of complications. As per the FDA  communication of July 2011, is important to understand that Dr.Enos Muhl has had 32 years of surgical experience in incontinence and pelvic organ repair, with special training in the implantation of pelvic organ prolapse mesh. He is an Secondary school teacher in the [employer] pelvic organ prolapse surgical teaching laboratories. In consideration of all types of pelvic organ repairs, please know that we have considered" no repair"; repair via  vaginal approach; repair via abdominal approach-using native tissue, absorbable tissue, and nonabsorbable mesh. Imelda Pillow, MD   Plan  BS UPHOLD sacrospinus repair Continue Premarin cream for now, but will switch to Estring post op because of convenience for patient. She is not sexually active, but has used an IUD in the past, and should be able to chang this herself.   Signatures Electronically signed by : [name], M.D.; Aug 28 2011  4:58PM

## 2011-09-15 NOTE — H&P (Signed)
66 yo bipolar female (Celexa, Xanax) returns today to review urodynamic results.  Hx of anterior vault prolapse, urinary hesitancy, weak flow, and hesitancy. Post SPARC 2004. She now notes vaginal prolapse outside the vagina for 2 months, using premarin cream. No ulceration. Denies constipation. Post hysterectonmy age 19. Not sexually active.   Video urodynamics was accomplished on 07/17/11, in the sitting position. The maximum capacity is 1320 cc. The bladder is hyposensitive, with first sensation occurring at 700 cc, normal desire at 1064 cc, strong desire at 1133 cc. The bladder stable. The patient did not leak for maximum abdominal leak point pressure at 165 cm to water. The patient did not leak with or without her prolapse reduced.  The voided via mist 331 cc with maximum flow rate of 25 cc per second. Maximum detrusor pressure is 12 cm to water and PVR is  89 cc. She sometimes needs to tilt her bladder in order to empty.  The patient appears to have a very large capacity, hyposensitive but stable bladder. There is no stress incontinence with or without the cystocele reduced. There is loss of bladder compliance and a large volumes, and there may be a large diverticulum on fluoroscopic examination. The patient double voids in the privacy of her home.   Past Medical History Problems  1. History of  Anxiety (Symptom) 300.00 2. History of  Arthritis V13.4 3. History of  Asthma 493.90 4. History of  Depression 311 5. History of  Hypercholesterolemia 272.0 6. History of  Hypothyroidism 244.9  Surgical History Problems  1. History of  Hemorrhoidectomy 2. History of  Hip Surgery 3. History of  Hysterectomy V45.77 4. History of  Vaginal Sling Operation For Stress Incontinence  Current Meds 1. ALPRAZolam TABS; Therapy: (Recorded:14May2013) to 2. Aspirin TABS; Therapy: (Recorded:14May2013) to 3. Citalopram Hydrobromide 40 MG Oral Tablet; Therapy: 05Nov2012 to 4. Flexeril TABS; Therapy:  (Recorded:14May2013) to 5. Levothyroxine Sodium 88 MCG Oral Tablet; Therapy: 26Feb2013 to 6. Simvastatin 20 MG Oral Tablet; Therapy: 26Feb2013 to 7. Sudafed TABS; Therapy: (Recorded:14May2013) to 8. Wellbutrin SR TBCR; Therapy: (Recorded:30Jul2013) to  Allergies Medication  1. Darvocet-N 100 TABS  Family History Problems  1. Maternal history of  Breast Cancer V16.3 2. Family history of  Family Health Status Number Of Children 2 daughters 3. Family history of  Father Deceased At Age 91 Heart disease 4. Paternal history of  Heart Disease V17.49 5. Family history of  Mother Deceased At Age 24 breast cancer  Social History Problems  1. Alcohol Use 1 2. Caffeine Use 8 per day 3. Former Smoker V15.82 1 ppd x 60yrs 4. Marital History - Divorced V61.03 5. Occupation: Retired  Event organiser, constitutional, skin, eye, otolaryngeal, hematologic/lymphatic, cardiovascular, pulmonary, endocrine, musculoskeletal, gastrointestinal, neurological and psychiatric system(s) were reviewed and pertinent findings if present are noted.  Genitourinary: urinary hesitancy, urinary stream starts and stops and initiating urination requires straining and weak flow.  ENT: sinus problems.  Musculoskeletal: back pain.  Psychiatric: depression and anxiety.    Vitals Vital Signs [Data Includes: Last 1 Day]  30Jul2013 08:30AM  Blood Pressure: 107 / 76 Temperature: 98.4 F Heart Rate: 95  Physical Exam Genitourinary:. Normal vaginal vault levator muscle tone. No pain.   Examination of the external genitalia shows normal female external genitalia, no vulvar atrophy and no lesions. The urethra is normal in appearance and not tender. There is no urethral mass. Vaginal exam demonstrates no abnormalities, no atrophy, no discharge, no tenderness,  the vaginal epithelium to be well estrogenized and no uterine prolapse. A cystocele is present with a midline defect (grade 3-4 /4). No enterocele  is identified. No rectocele is identified. The cervix is is absent. There is no cervical discharge. The uterus is absent, but non tender and not enlarged. The adnexa are palpably normal. The bladder is normal on palpation, non tender and not distended. The anus is normal on inspection. The perineum is normal on inspection.    Assessment Assessed  1. 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 she needs anteriot vault sacrospinus repair. We have reviewed her urodynamics, showing an enlarged bladder, but one which she empties with double voiding technique. She has a stable bladder, and does not leak , even with her Stage II anterior vault prolapse reduced.   We have discussed alternatives for her repair, and possibilities for biologic and mesh repairs. We have discussed mesh lawsuits, and  my own experience with both biologic and mesh repairs. I think she has loss of Level I support, and would best be served with sacrospinus suspensiion, with UPHOLD mesh repair, wht  a 3% chance of mesh extrusion, and 10% chance of de novo urge/urge incintinence. i will include information which I have written for patients, and have discussed with her the UPHOLD post marketing study. she understandsvthat I teach/monitor for [employer].      I have given her my patient handout, indicating that she has been found to have a hernia-or weakness- in the vaginal wall, allowing one or more organs to begin to descend-both by the pull of gravity, and by the push of abdominal straining-into, and occasionally through-the vagina. This anatomic herniation is called Pelvic Organ Prolapse(POP).     She may be found to have:  (1) weakness of the urethra-resulting in "stress "(cough laugh, or sneeze) urinary incontinence; (2) weakness of the anterior vaginal wall, resulting in a Cystocele;  and (3), weakness  of the posterior vaginal wall, resulting in an Enterocele(small intestine herniation  high up through the posterior vaginal wall); or a Rectocele(rectum "herniating" through the posterior vaginal wall).   These  anatomic problems may be directly related to childbirth, neuropathies, diabetes, constipation, cigarette smoking, steroid use, diseases of the connective tissue(eg: lupus, steroids for chronic asthma), or previous pelvic surgery, radiation, or chemotherapy.     The surgical repair which is recommended for you is based on an integration of your symptoms, your medical history, your physical examination; as well as your video urodynamic studies, and laboratory evaluation. In the end, the final anatomic evaluation is performed under anesthesia, and it is for this reason, that the end result operation may be slightly different than the preoperative evaluation might suggest. We are prepared to repair the pelvic floor in any number of ways, which have been explained to you.  Pelvic floor repair was first reported in 1913, with success rates of 50-90%. However, long-term followup of repairs using absorbable tissue has shown 20% to 50% recurrence rates. Therefore, when synthetic mesh was introduced into pelvic floor repairs in 2000, it was quickly incorporated into surgical approaches. However, early evaluation of surgical mesh (no longer in use)(2000 -2005) showed technical flaws which led to erosion of the mesh through the vaginal incision. This has led to multiple lawsuits with regard to the  use of vaginal mesh. Currently, mesh erosion is noted to be a complication of pelvic organ prolapse surgery, as reported in 2-11% of cases.  Evaluation of a single surgeon's experience with the newest vaginal mesh [facility] Scientific mesh, Patsi Sears, January 2009 through August 2011) has shown no erosions from pubovaginal sling surgery, and a 2% extrusion rate from other larger pelvic floor organ  repair surgery. These extrusions have been treated by vaginal hormone treatment, and surgical excision of a tiny portion of the extruded mesh. There have been no severe long-term complications noted (up to 4 year follow-up).    There are several approaches for repair for pelvic organ prolapse. Experience has shown that pubovaginal sling for stress urinary incontinence is most efficiently implanted via a transvaginal route. Patients may either have the standard pubovaginal sling via a retropubic approach, transobturator approach, or single-incision mid-urethral sling. The retropubic route probably gives  the most support to the urethra, whereas the single-incision mid-urethral sling presents the least volume of mesh material. Anterior vault suspension is accomplished by placing mesh between the bladder  and vaginal wall, and attaching the mesh to the sacrospinous ligaments. This prevents the anterior vault hernia from recurring. If the hysterectomy is accomplished at the same time, then absorbable tissue is recommended to be used instead of mesh, because of an increased chance of mesh erosion at the incision line. Anterior vault suspension can also be accomplished via the laparoscopic robotic approach. While this approach gives excellent results, it is more expensive, and more invasive. Transvaginal surgery allows the patient to have natural orifice surgical approach ("Notes" approach), which is less invasive, and less expensive, while providing superb results. Recent evaluation (see above) has shown a single surgeon's results with 100 cases of anterior and posterior vault suspension using [employer] mesh. These results show a 2% recurrence rate, 3% extrusion rate, on patients with grade 3-4 prolapse (worst prolapse).  Common questions for patient's regarding pelvic organ prolapse are as follows:  #1  Is mesh to be used in my surgery?   #2   Am I a good candidate for mesh?  #3 Why is mesh the best option  for my repair?  #4 What are the alternatives to transvaginal mesh for repair of my prolapse?  #5 Could my surgery be performed without using mesh? #6 Will my partner be able to feel the surgical mesh during sexual intercourse, and what if the mesh erodes through the vaginal wall?  #7 How often have you ,as a Careers adviser ,implanted this particular product?  #8 What results have your other patients  had with this product?  #9 What can I expect to feel after surgery, and for how long?  #10 Which specific side effects should I report after surgery ? #11 What if the mesh surgery doesn't correct the problem ?        I believe surgical mesh implantation will correct your pelvic organ prolapse problem, with a very low chance of complications. As per the FDA  communication of July 2011, is important to understand that Dr.Rc Amison has had 32 years of surgical experience in incontinence and pelvic organ repair, with special training in the implantation of pelvic organ prolapse mesh. He is an Secondary school teacher in the [employer] pelvic organ prolapse surgical teaching laboratories. In consideration of all types of pelvic organ repairs, please know that we have considered" no repair"; repair via vaginal approach; repair via abdominal approach-using native tissue, absorbable tissue, and nonabsorbable mesh. Imelda Pillow, MD  Plan  BS UPHOLD sacrospinus repair Continue Premarin cream for now, but will switch to Estring post op because of convenience for patient. She is not sexually active, but has used an IUD in the past, and should be able to chang this herself.   Signatures Electronically signed by : [name], M.D.; Aug 28 2011  4:58PM

## 2011-09-17 ENCOUNTER — Encounter (HOSPITAL_COMMUNITY)
Admission: RE | Disposition: A | Discharge status: Home / Self Care | Payer: Self-pay | Source: Ambulatory Visit | Attending: Urology

## 2011-09-17 ENCOUNTER — Encounter (HOSPITAL_BASED_OUTPATIENT_CLINIC_OR_DEPARTMENT_OTHER): Payer: Self-pay | Admitting: Anesthesiology

## 2011-09-17 ENCOUNTER — Ambulatory Visit (HOSPITAL_BASED_OUTPATIENT_CLINIC_OR_DEPARTMENT_OTHER): Payer: Medicare Other | Admitting: Anesthesiology

## 2011-09-17 ENCOUNTER — Encounter (HOSPITAL_COMMUNITY): Payer: Self-pay | Admitting: *Deleted

## 2011-09-17 ENCOUNTER — Other Ambulatory Visit: Payer: Self-pay

## 2011-09-17 ENCOUNTER — Ambulatory Visit (HOSPITAL_BASED_OUTPATIENT_CLINIC_OR_DEPARTMENT_OTHER)
Admission: RE | Admit: 2011-09-17 | Discharge: 2011-09-18 | Disposition: A | Discharge status: Home / Self Care | Payer: Medicare Other | Source: Ambulatory Visit | Attending: Urology | Admitting: Urology

## 2011-09-17 ENCOUNTER — Encounter (HOSPITAL_BASED_OUTPATIENT_CLINIC_OR_DEPARTMENT_OTHER): Payer: Self-pay | Admitting: *Deleted

## 2011-09-17 DIAGNOSIS — Z01812 Encounter for preprocedural laboratory examination: Secondary | ICD-10-CM | POA: Insufficient documentation

## 2011-09-17 DIAGNOSIS — E039 Hypothyroidism, unspecified: Secondary | ICD-10-CM | POA: Insufficient documentation

## 2011-09-17 DIAGNOSIS — J45909 Unspecified asthma, uncomplicated: Secondary | ICD-10-CM | POA: Insufficient documentation

## 2011-09-17 DIAGNOSIS — Z79899 Other long term (current) drug therapy: Secondary | ICD-10-CM | POA: Insufficient documentation

## 2011-09-17 DIAGNOSIS — E7800 Pure hypercholesterolemia, unspecified: Secondary | ICD-10-CM | POA: Insufficient documentation

## 2011-09-17 DIAGNOSIS — Z7982 Long term (current) use of aspirin: Secondary | ICD-10-CM | POA: Insufficient documentation

## 2011-09-17 DIAGNOSIS — Z01810 Encounter for preprocedural cardiovascular examination: Secondary | ICD-10-CM | POA: Insufficient documentation

## 2011-09-17 DIAGNOSIS — Z90710 Acquired absence of both cervix and uterus: Secondary | ICD-10-CM | POA: Insufficient documentation

## 2011-09-17 DIAGNOSIS — N8111 Cystocele, midline: Secondary | ICD-10-CM

## 2011-09-17 HISTORY — DX: Depression, unspecified: F32.A

## 2011-09-17 HISTORY — DX: Anxiety disorder, unspecified: F41.9

## 2011-09-17 HISTORY — DX: Unspecified osteoarthritis, unspecified site: M19.90

## 2011-09-17 HISTORY — DX: Cystocele, unspecified: N81.10

## 2011-09-17 HISTORY — DX: Hypothyroidism, unspecified: E03.9

## 2011-09-17 HISTORY — DX: Hyperlipidemia, unspecified: E78.5

## 2011-09-17 HISTORY — PX: PUBOVAGINAL SLING: SHX1035

## 2011-09-17 HISTORY — DX: Major depressive disorder, single episode, unspecified: F32.9

## 2011-09-17 LAB — POCT HEMOGLOBIN-HEMACUE: Hemoglobin: 11.1 g/dL — ABNORMAL LOW (ref 12.0–15.0)

## 2011-09-17 SURGERY — CREATION, PUBOVAGINAL SLING
Anesthesia: General | Site: Vagina | Wound class: Clean Contaminated

## 2011-09-17 MED ORDER — BISACODYL 5 MG PO TBEC
5.0000 mg | DELAYED_RELEASE_TABLET | Freq: Every day | ORAL | Status: DC | PRN
  Administered 2011-09-17: 5 mg via ORAL
  Filled 2011-09-17: qty 1

## 2011-09-17 MED ORDER — ACETAMINOPHEN 10 MG/ML IV SOLN
1000.0000 mg | Freq: Four times a day (QID) | INTRAVENOUS | Status: AC
  Administered 2011-09-17 – 2011-09-18 (×4): 1000 mg via INTRAVENOUS
  Filled 2011-09-17 (×3): qty 100

## 2011-09-17 MED ORDER — TRAZODONE HCL 50 MG PO TABS
50.0000 mg | ORAL_TABLET | Freq: Every evening | ORAL | Status: DC | PRN
  Administered 2011-09-18: 50 mg via ORAL
  Filled 2011-09-17: qty 1

## 2011-09-17 MED ORDER — FENTANYL CITRATE 0.05 MG/ML IJ SOLN
INTRAMUSCULAR | Status: DC | PRN
  Administered 2011-09-17: 50 ug via INTRAVENOUS
  Administered 2011-09-17 (×2): 25 ug via INTRAVENOUS
  Administered 2011-09-17 (×2): 50 ug via INTRAVENOUS

## 2011-09-17 MED ORDER — OXYCODONE HCL 5 MG PO TABS: 5.0000 mg | ORAL_TABLET | Freq: Once | ORAL | Status: DC | PRN

## 2011-09-17 MED ORDER — LIDOCAINE HCL (CARDIAC) 20 MG/ML IV SOLN
INTRAVENOUS | Status: DC | PRN
  Administered 2011-09-17: 60 mg via INTRAVENOUS

## 2011-09-17 MED ORDER — PROMETHAZINE HCL 25 MG/ML IJ SOLN: 6.2500 mg | INTRAMUSCULAR | Status: DC | PRN

## 2011-09-17 MED ORDER — ACETAMINOPHEN 10 MG/ML IV SOLN: 1000.0000 mg | Freq: Once | INTRAVENOUS | Status: DC | PRN

## 2011-09-17 MED ORDER — INDIGOTINDISULFONATE SODIUM 8 MG/ML IJ SOLN
INTRAMUSCULAR | Status: DC | PRN
Start: 2011-09-17 — End: 2011-09-17
  Administered 2011-09-17 (×2): 5 mL via INTRAVENOUS

## 2011-09-17 MED ORDER — BELLADONNA ALKALOIDS-OPIUM 16.2-60 MG RE SUPP
RECTAL | Status: DC | PRN
  Administered 2011-09-17: 1 via RECTAL

## 2011-09-17 MED ORDER — ONDANSETRON HCL 4 MG/2ML IJ SOLN
INTRAMUSCULAR | Status: DC | PRN
  Administered 2011-09-17: 4 mg via INTRAVENOUS

## 2011-09-17 MED ORDER — LACTATED RINGERS IV SOLN
INTRAVENOUS | Status: DC
  Administered 2011-09-17 (×2): via INTRAVENOUS

## 2011-09-17 MED ORDER — DEXAMETHASONE SODIUM PHOSPHATE 4 MG/ML IJ SOLN
INTRAMUSCULAR | Status: DC | PRN
  Administered 2011-09-17: 8 mg via INTRAVENOUS

## 2011-09-17 MED ORDER — HYDROMORPHONE HCL PF 1 MG/ML IJ SOLN: 0.2500 mg | INTRAMUSCULAR | Status: DC | PRN

## 2011-09-17 MED ORDER — PROPOFOL 10 MG/ML IV EMUL
INTRAVENOUS | Status: DC | PRN
  Administered 2011-09-17: 180 mg via INTRAVENOUS

## 2011-09-17 MED ORDER — SODIUM CHLORIDE 0.9 % IJ SOLN
INTRAMUSCULAR | Status: DC | PRN
  Administered 2011-09-17: 50 mL

## 2011-09-17 MED ORDER — HYDROMORPHONE HCL PF 1 MG/ML IJ SOLN: 0.5000 mg | INTRAMUSCULAR | Status: DC | PRN

## 2011-09-17 MED ORDER — LEVOTHYROXINE SODIUM 88 MCG PO TABS
88.0000 ug | ORAL_TABLET | Freq: Every day | ORAL | Status: DC
  Administered 2011-09-18: 88 ug via ORAL
  Filled 2011-09-17 (×2): qty 1

## 2011-09-17 MED ORDER — STERILE WATER FOR IRRIGATION IR SOLN
Status: DC | PRN
  Administered 2011-09-17: 1

## 2011-09-17 MED ORDER — ONDANSETRON HCL 4 MG/2ML IJ SOLN: 4.0000 mg | INTRAMUSCULAR | Status: DC | PRN

## 2011-09-17 MED ORDER — DIPHENHYDRAMINE HCL 50 MG/ML IJ SOLN: 12.5000 mg | Freq: Four times a day (QID) | INTRAMUSCULAR | Status: DC | PRN

## 2011-09-17 MED ORDER — CYCLOBENZAPRINE HCL 10 MG PO TABS
10.0000 mg | ORAL_TABLET | Freq: Three times a day (TID) | ORAL | Status: DC | PRN
  Administered 2011-09-18: 10 mg via ORAL
  Filled 2011-09-17: qty 1

## 2011-09-17 MED ORDER — MIDAZOLAM HCL 5 MG/5ML IJ SOLN
INTRAMUSCULAR | Status: DC | PRN
  Administered 2011-09-17: 2 mg via INTRAVENOUS

## 2011-09-17 MED ORDER — BUPIVACAINE-EPINEPHRINE 0.5% -1:200000 IJ SOLN
INTRAMUSCULAR | Status: DC | PRN
  Administered 2011-09-17: 30 mL

## 2011-09-17 MED ORDER — OXYCODONE HCL 5 MG/5ML PO SOLN: 5.0000 mg | Freq: Once | ORAL | Status: DC | PRN

## 2011-09-17 MED ORDER — CEFAZOLIN SODIUM-DEXTROSE 2-3 GM-% IV SOLR
2.0000 g | INTRAVENOUS | Status: AC
  Administered 2011-09-17: 2 g via INTRAVENOUS

## 2011-09-17 MED ORDER — ESTRADIOL 0.1 MG/GM VA CREA
TOPICAL_CREAM | VAGINAL | Status: DC | PRN
  Administered 2011-09-17: 1 via VAGINAL

## 2011-09-17 MED ORDER — DIPHENHYDRAMINE HCL 12.5 MG/5ML PO ELIX: 12.5000 mg | ORAL_SOLUTION | Freq: Four times a day (QID) | ORAL | Status: DC | PRN

## 2011-09-17 MED ORDER — SODIUM CHLORIDE 0.9 % IR SOLN
Status: DC | PRN
  Administered 2011-09-17: 11:00:00

## 2011-09-17 MED ORDER — FLUTICASONE PROPIONATE 50 MCG/ACT NA SUSP
2.0000 | Freq: Every morning | NASAL | Status: DC
  Filled 2011-09-17: qty 16

## 2011-09-17 MED ORDER — KETOROLAC TROMETHAMINE 30 MG/ML IJ SOLN
INTRAMUSCULAR | Status: DC | PRN
Start: 2011-09-17 — End: 2011-09-17
  Administered 2011-09-17: 30 mg via INTRAVENOUS

## 2011-09-17 MED ORDER — MEPERIDINE HCL 25 MG/ML IJ SOLN: 6.2500 mg | INTRAMUSCULAR | Status: DC | PRN

## 2011-09-17 MED ORDER — SODIUM CHLORIDE 0.45 % IV SOLN
INTRAVENOUS | Status: DC
  Administered 2011-09-17 – 2011-09-18 (×2): via INTRAVENOUS

## 2011-09-17 MED ORDER — CIPROFLOXACIN HCL 500 MG PO TABS
500.0000 mg | ORAL_TABLET | Freq: Two times a day (BID) | ORAL | Status: DC
  Administered 2011-09-17 – 2011-09-18 (×2): 500 mg via ORAL
  Filled 2011-09-17 (×4): qty 1

## 2011-09-17 MED ORDER — CEFAZOLIN SODIUM 1-5 GM-% IV SOLN: 1.0000 g | INTRAVENOUS | Status: DC

## 2011-09-17 MED ORDER — CITALOPRAM HYDROBROMIDE 40 MG PO TABS
40.0000 mg | ORAL_TABLET | Freq: Every morning | ORAL | Status: DC
  Administered 2011-09-18: 40 mg via ORAL
  Filled 2011-09-17: qty 1

## 2011-09-17 MED ORDER — HYDROCODONE-ACETAMINOPHEN 5-325 MG PO TABS: 1.0000 | ORAL_TABLET | ORAL | Status: DC | PRN

## 2011-09-17 MED ORDER — ALPRAZOLAM 0.5 MG PO TABS
0.5000 mg | ORAL_TABLET | Freq: Every morning | ORAL | Status: DC
  Administered 2011-09-17 – 2011-09-18 (×2): 0.5 mg via ORAL
  Filled 2011-09-17 (×2): qty 1

## 2011-09-17 MED ORDER — SIMVASTATIN 20 MG PO TABS
20.0000 mg | ORAL_TABLET | Freq: Every evening | ORAL | Status: DC
  Filled 2011-09-17: qty 1

## 2011-09-17 SURGICAL SUPPLY — 57 items
BAG URINE DRAINAGE (UROLOGICAL SUPPLIES) IMPLANT
BLADE SURG 10 STRL SS (BLADE) ×2 IMPLANT
BLADE SURG 15 STRL LF DISP TIS (BLADE) ×1 IMPLANT
BLADE SURG 15 STRL SS (BLADE) ×1
BLADE SURG ROTATE 9660 (MISCELLANEOUS) ×2 IMPLANT
BOOTIES KNEE HIGH SLOAN (MISCELLANEOUS) ×2 IMPLANT
CANISTER SUCTION 1200CC (MISCELLANEOUS) IMPLANT
CANISTER SUCTION 2500CC (MISCELLANEOUS) ×4 IMPLANT
CATH FOLEY 2WAY SLVR  5CC 16FR (CATHETERS) ×1
CATH FOLEY 2WAY SLVR 5CC 16FR (CATHETERS) ×1 IMPLANT
CLOTH BEACON ORANGE TIMEOUT ST (SAFETY) ×2 IMPLANT
COVER MAYO STAND STRL (DRAPES) ×2 IMPLANT
COVER TABLE BACK 60X90 (DRAPES) ×2 IMPLANT
DERMABOND ADVANCED (GAUZE/BANDAGES/DRESSINGS)
DERMABOND ADVANCED .7 DNX12 (GAUZE/BANDAGES/DRESSINGS) IMPLANT
DEVICE CAPIO SLIM SINGLE (INSTRUMENTS) IMPLANT
DEVICE CAPIO SUTURING (INSTRUMENTS)
DEVICE CAPIO SUTURING OPC (INSTRUMENTS) IMPLANT
DISSECTOR ROUND CHERRY 3/8 STR (MISCELLANEOUS) ×2 IMPLANT
DRAPE CAMERA CLOSED 9X96 (DRAPES) ×2 IMPLANT
DRAPE UNDERBUTTOCKS STRL (DRAPE) ×2 IMPLANT
FLOSEAL 10ML (HEMOSTASIS) IMPLANT
GAUZE SPONGE 4X4 16PLY XRAY LF (GAUZE/BANDAGES/DRESSINGS) ×2 IMPLANT
GLOVE BIO SURGEON STRL SZ7.5 (GLOVE) ×2 IMPLANT
GOWN PREVENTION PLUS LG XLONG (DISPOSABLE) ×2 IMPLANT
GOWN STRL REIN XL XLG (GOWN DISPOSABLE) ×2 IMPLANT
NEEDLE 1/2 CIR CATGUT .05X1.09 (NEEDLE) IMPLANT
NEEDLE HYPO 22GX1.5 SAFETY (NEEDLE) ×4 IMPLANT
PACKING VAGINAL (PACKING) ×2 IMPLANT
PENCIL BUTTON HOLSTER BLD 10FT (ELECTRODE) ×2 IMPLANT
PLUG CATH AND CAP STER (CATHETERS) ×2 IMPLANT
RETRACTOR LONRSTAR 16.6X16.6CM (MISCELLANEOUS) ×1 IMPLANT
RETRACTOR STAY HOOK 5MM (MISCELLANEOUS) ×2 IMPLANT
RETRACTOR STER APS 16.6X16.6CM (MISCELLANEOUS) ×2
SET IRRIG Y TYPE TUR BLADDER L (SET/KITS/TRAYS/PACK) ×2 IMPLANT
SHEET LAVH (DRAPES) ×2 IMPLANT
SLING SOLYX SYSTEM SIS BX (SLING) IMPLANT
SPONGE LAP 4X18 X RAY DECT (DISPOSABLE) ×2 IMPLANT
SUCTION FRAZIER TIP 10 FR DISP (SUCTIONS) ×2 IMPLANT
SUT CAPIO POLYGLYCOLIC (SUTURE) IMPLANT
SUT ETHILON 2 0 PS N (SUTURE) IMPLANT
SUT MON AB 2-0 SH 27 (SUTURE)
SUT MON AB 2-0 SH27 (SUTURE) IMPLANT
SUT SILK 3 0 PS 1 (SUTURE) ×2 IMPLANT
SUT VIC AB 0 CT1 36 (SUTURE) IMPLANT
SUT VIC AB 2-0 CT1 27 (SUTURE) ×1
SUT VIC AB 2-0 CT1 TAPERPNT 27 (SUTURE) ×1 IMPLANT
SUT VIC AB 2-0 UR6 27 (SUTURE) ×10 IMPLANT
SYR BULB IRRIGATION 50ML (SYRINGE) ×2 IMPLANT
SYR CONTROL 10ML LL (SYRINGE) ×4 IMPLANT
SYRINGE 10CC LL (SYRINGE) ×2 IMPLANT
TRAY DSU PREP LF (CUSTOM PROCEDURE TRAY) ×2 IMPLANT
TUBE CONNECTING 12X1/4 (SUCTIONS) ×4 IMPLANT
UPHOLD Lite Vaginal Support System ×2 IMPLANT
WATER STERILE IRR 3000ML UROMA (IV SOLUTION) ×2 IMPLANT
WATER STERILE IRR 500ML POUR (IV SOLUTION) ×2 IMPLANT
YANKAUER SUCT BULB TIP NO VENT (SUCTIONS) IMPLANT

## 2011-09-17 NOTE — Progress Notes (Signed)
Pt transferred to room 1436 w foley cath, vaginal packing w blue string,without noted drainage. Pt tolerated transfer well without complications.

## 2011-09-17 NOTE — Op Note (Signed)
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  Postoperative diagnosis: Same   Operation: [employer] uphold light anterior vault sacrospinous mesh fixation and Kelly plication   Surgeon:  S. Patsi Sears, MD  First assistant: None   [id number]}  Preparation: After appropriate preanesthesia, the patient was brought to the operating room, and placed on the operating table in the dorsal supine position where general LMA anesthesia was introduced. Armband was double checked. She was replaced in the dorsal lithotomy position where the pubis was prepped with Betadine solution and draped in usual fashion. The horseshoe shaped [place] retractor was used. Foley catheter was placed. Examination showed stage III anterior vault prolapse approximately 1 cm external to the hymenal ring. There was no rectocele noted. There was loss of level I support. The urethra was in normal position. There was no evidence of any surgical abnormality from previously placed mesh for anti-incontinence procedure.   Review history:66 yo bipolar female (Celexa, Xanax) has a  hx of anterior vault prolapse, urinary hesitancy, weak flow, and hesitancy. Post SPARC 2004. She now notes vaginal prolapse outside the vagina for 2 months, using premarin cream. No ulceration. Denies constipation. Post hysterectonmy age 36. Not sexually active.  Video urodynamics was accomplished on 07/17/11, in the sitting position. The maximum capacity is 1320 cc. The bladder is hyposensitive, with first sensation occurring at 700 cc, normal desire at 1064 cc, strong desire at 1133 cc. The bladder is stable. The patient did not leak for maximum abdominal leak point pressure at 165 cm to water. The patient did not leak with or without her prolapse reduced.  Voided volume is  331 cc with maximum flow rate of 25 cc per second. Maximum detrusor pressure is 12 cm of water and PVR is 89 cc. She  sometimes needs to tilt her bladder in order to empty.  The patient appears to have a very large capacity, which is  hyposensitive - but stable bladder. There is no stress incontinence with or without the cystocele reduced. There is loss of bladder compliance with large volumes, and there may be a large diverticulum on fluoroscopic examination. The patient double voids in the privacy of her home.      Statement of  Likelihood of Success: Excellent. TIME-OUT observed.:  Procedure:  Using a marking pen, the bladder neck was outlined proximal to the urethra. A 0.2 cm proximal to the bladder neck, a horizontal mark was made, and 50 cc of Marcaine 25 with epinephrine 1 200,000 was injected for hydrodissection, as well as for hemostasis. Injection was carried to the level of the ischial spines.  Incision was then made horizontally along the line that was previously marked. Subcutaneous tissue was dissected. The large anterior prolapse was dissected. I then dissected through the pelvic floor, in order to palpate the ischial spines bilaterally, and then to palpate the sacrospinous ligaments bilaterally. Following dissection of the cystocele, a small Kelly plication was accomplished, using 2-0 Vicryl horizontal mattress sutures.  Following this, the Cappio device was placed bilaterally, and the Uphold Lite pelvic floor mesh suspension system was placed into the sacrospinous ligaments bilaterally, 2 fingerbreadths medial to the ischial spines.  The mesh was then tacked in place with 2-0 Vicryl suture, both proximal or, and distalward. It lay smooth against the bladder. The plastic arms were then brought across the midline, and then cut in standard fashion, and the plastic arms removed along with the mesh sleeves. There was  minimal tension on the mesh, and the mesh lay smooth against the bladder. There was no bleeding noted.  Closure: Using 2-0 Vicryl sutures, the wound was closed from the midline to the right  lateral edge, and from the midline to the left lateral edge. Estrace cream was placed, and vaginal packing was placed with a blue string outside the vagina.  The patient received IV Tylenol at the beginning of the procedure, and IV Toradol at the end of the procedure. She also received Ancef 3 g IV. She was awakened, taken to recovery room in good condition.

## 2011-09-17 NOTE — Progress Notes (Signed)
Report given to nurse [phone] / Thedore Mins RN.

## 2011-09-17 NOTE — Interval H&P Note (Signed)
History and Physical Interval Note:  09/17/2011 9:00 AM  [name]  has presented today for surgery, with the diagnosis of ANTERIOR PELVIC FLOOR PROLAPSE  The various methods of treatment have been discussed with the patient and family. After consideration of risks, benefits and other options for treatment, the patient has consented to  Procedure(s) (LRB): PUBO-VAGINAL SLING (N/A) as a surgical intervention .  The patient's history has been reviewed, patient examined, no change in status, stable for surgery.  I have reviewed the patient's chart and labs.  Questions were answered to the patient's satisfaction.     [name] I

## 2011-09-17 NOTE — Progress Notes (Signed)
Urology Progress Note  Day of Surgery  AF, VSS  Subjective:     No acute urologic events overnight. Ambulation:   positive Flatus:    positive Bowel movement  negative  Pain: complete resolution  Objective:  Blood pressure 150/95, pulse 75, temperature 98.1 F (36.7 C), temperature source Oral, resp. rate 18, height 5' 2.5" (1.588 m), weight 66.679 kg (147 lb), SpO2 98.00%.  Physical Exam:  General:  No acute distress, awake Resp: clear to auscultation bilaterally Genitourinary:  nml BUS Foley:in place. Clear urine.        Recent Labs  [facility] 09/17/11 0943   HGB 11.1*   WBC --   PLT --    No results found for this basename: NA:2,K:2,CL:2,CO2:2,BUN:2,CREATININE:2,CALCIUM:2,MAGNESIUM:2,GFRNONAA:2,GFRAA:2 in the last 72 hours   No results found for this basename: PT:2,INR:2,APTT:2 in the last 72 hours   No components found with this basename: ABG:2  Assessment/Plan:  Catheter not removed. Continue any current medications. Pt is to increase activities as tolerated. No swimming until cleared ( 4 weeks).  Anticipatre D/c in AM

## 2011-09-17 NOTE — Anesthesia Preprocedure Evaluation (Addendum)
Anesthesia Evaluation  Patient identified by MRN, date of birth, ID band Patient awake    Reviewed: Allergy & Precautions, H&P , NPO status , Patient's Chart, lab work & pertinent test results  Airway Mallampati: II TM Distance: >3 FB Neck ROM: Full    Dental  (+) Teeth Intact and Dental Advisory Given   Pulmonary neg pulmonary ROS,  breath sounds clear to auscultation  Pulmonary exam normal       Cardiovascular Exercise Tolerance: Good Rhythm:Regular Rate:Normal     Neuro/Psych Anxiety Depression negative neurological ROS     GI/Hepatic negative GI ROS, Neg liver ROS,   Endo/Other  Hypothyroidism   Renal/GU negative Renal ROS     Musculoskeletal   Abdominal (+) - obese,  Abdomen: soft.    Peds  Hematology negative hematology ROS (+)   Anesthesia Other Findings   Reproductive/Obstetrics                          Anesthesia Physical Anesthesia Plan  ASA: II  Anesthesia Plan: General   Post-op Pain Management:    Induction: Intravenous  Airway Management Planned: LMA  Additional Equipment:   Intra-op Plan:   Post-operative Plan: Extubation in OR  Informed Consent: I have reviewed the patients History and Physical, chart, labs and discussed the procedure including the risks, benefits and alternatives for the proposed anesthesia with the patient or authorized representative who has indicated his/her understanding and acceptance.   Dental advisory given  Plan Discussed with: CRNA and Surgeon  Anesthesia Plan Comments:         Anesthesia Quick Evaluation

## 2011-09-17 NOTE — Anesthesia Postprocedure Evaluation (Signed)
Anesthesia Post Note  Patient: [name]  Procedure(s) Performed: Procedure(s) (LRB): PUBO-VAGINAL SLING (N/A)  Anesthesia type: General  Patient location: PACU  Post pain: Pain level controlled  Post assessment: Post-op Vital signs reviewed  Last Vitals: BP 115/79  Pulse 69  Temp 36.2 C (Oral)  Resp 18  Ht 5' 2.5" (1.588 m)  Wt 147 lb (66.679 kg)  BMI 26.46 kg/m2  SpO2 94%  Post vital signs: Reviewed  Level of consciousness: sedated  Complications: No apparent anesthesia complications

## 2011-09-17 NOTE — Transfer of Care (Signed)
Immediate Anesthesia Transfer of Care Note  Patient: [name]  Procedure(s) Performed: Procedure(s) (LRB): PUBO-VAGINAL SLING (N/A)  Patient Location: PACU  Anesthesia Type: General  Level of Consciousness: awake, oriented, sedated and patient cooperative  Airway & Oxygen Therapy: Patient Spontanous Breathing and Patient connected to face mask oxygen  Post-op Assessment: Report given to PACU RN and Post -op Vital signs reviewed and stable  Post vital signs: Reviewed and stable  Complications: No apparent anesthesia complications

## 2011-09-17 NOTE — Anesthesia Procedure Notes (Signed)
Procedure Name: LMA Insertion Date/Time: 09/17/2011 10:12 AM Performed by: Renella Cunas D Pre-anesthesia Checklist: Patient identified, Emergency Drugs available, Suction available and Patient being monitored Patient Re-evaluated:Patient Re-evaluated prior to inductionOxygen Delivery Method: Circle System Utilized Preoxygenation: Pre-oxygenation with 100% oxygen Intubation Type: IV induction Ventilation: Mask ventilation without difficulty LMA: LMA inserted LMA Size: 4.0 Number of attempts: 1 Airway Equipment and Method: bite block Placement Confirmation: positive ETCO2 Tube secured with: Tape Dental Injury: Teeth and Oropharynx as per pre-operative assessment

## 2011-09-18 ENCOUNTER — Encounter (HOSPITAL_BASED_OUTPATIENT_CLINIC_OR_DEPARTMENT_OTHER): Payer: Self-pay | Admitting: Urology

## 2011-09-18 MED ORDER — TRAMADOL-ACETAMINOPHEN 37.5-325 MG PO TABS
1.0000 | ORAL_TABLET | Freq: Once | ORAL | Status: AC
  Administered 2011-09-18: 1 via ORAL
  Filled 2011-09-18: qty 1

## 2011-09-18 MED ORDER — TRAMADOL-ACETAMINOPHEN 37.5-325 MG PO TABS: 1.0000 | ORAL_TABLET | Freq: Four times a day (QID) | ORAL | Status: AC | PRN

## 2011-09-18 MED ORDER — CIPROFLOXACIN HCL 500 MG PO TABS: 500.0000 mg | ORAL_TABLET | Freq: Two times a day (BID) | ORAL | Status: AC

## 2011-09-18 NOTE — Progress Notes (Signed)
Pt has voided .  Foley removed at 0600.  Per PVR monitoring criteria, no bladder scan necessary at this time.  Will continue to monitor voiding.  Ardyth Gal, RN 09/18/2011

## 2011-09-18 NOTE — Discharge Summary (Signed)
Physician Discharge Summary  Patient ID: [name] MRN: [id number] DOB/AGE: 1945/09/23 66 y.o.  Admit date: 09/17/2011 Discharge date: 09/18/2011  Admission Diagnoses: anterior vaginal vault prolapse  Discharge Diagnoses:  Active Problems:  * No active hospital problems. *    Discharged Condition: good  Hospital Course: surgery 09/17/11  Consults: None  Significant Diagnostic Studies:   Treatments: surgery  Discharge Exam: Blood pressure 119/71, pulse 65, temperature 97.9 F (36.6 C), temperature source Oral, resp. rate 18, height 5' 2.5" (1.588 m), weight 66.679 kg (147 lb), SpO2 99.00%. General appearance: alert, cooperative and no distress no vaginal discharge  Disposition:   Discharge Orders    Future Orders Please Complete By Expires   Diet - low sodium heart healthy      Increase activity slowly      Discontinue IV       Ultracet q 6h prn pain  Follow-up Information    Follow up with [name] I, MD. (per appoinrment)    Contact information:   [address] [facility] [place] [address] [phone]          Signed: [name] I 09/18/2011, 8:39 AM

## 2011-09-21 ENCOUNTER — Encounter (HOSPITAL_COMMUNITY): Payer: Self-pay

## 2011-11-08 ENCOUNTER — Telehealth: Payer: Self-pay | Admitting: *Deleted

## 2011-11-08 MED ORDER — ESTROGENS, CONJUGATED 0.625 MG/GM VA CREA: TOPICAL_CREAM | VAGINAL | Status: DC

## 2011-11-08 NOTE — Telephone Encounter (Signed)
Pt called requesting premarin vaginal cream Rx sent to her home to take to other pharmacy where she can get medication cheaper. Rx will be mailed to home address per pt request.

## 2011-11-20 ENCOUNTER — Other Ambulatory Visit: Payer: Self-pay | Admitting: *Deleted

## 2011-11-20 DIAGNOSIS — N63 Unspecified lump in unspecified breast: Secondary | ICD-10-CM

## 2011-11-27 ENCOUNTER — Other Ambulatory Visit: Payer: Self-pay | Admitting: Obstetrics and Gynecology

## 2011-11-27 DIAGNOSIS — N63 Unspecified lump in unspecified breast: Secondary | ICD-10-CM

## 2012-01-02 ENCOUNTER — Other Ambulatory Visit: Payer: Self-pay | Admitting: Orthopedic Surgery

## 2012-01-02 DIAGNOSIS — M542 Cervicalgia: Secondary | ICD-10-CM

## 2012-01-02 DIAGNOSIS — M545 Low back pain: Secondary | ICD-10-CM

## 2012-01-02 DIAGNOSIS — R202 Paresthesia of skin: Secondary | ICD-10-CM

## 2012-01-06 ENCOUNTER — Other Ambulatory Visit: Payer: Medicare Other

## 2012-01-06 ENCOUNTER — Inpatient Hospital Stay: Admission: RE | Admit: 2012-01-06 | Payer: Medicare Other | Source: Ambulatory Visit

## 2012-07-21 ENCOUNTER — Telehealth: Payer: Self-pay | Admitting: *Deleted

## 2012-07-21 MED ORDER — ESTROGENS, CONJUGATED 0.625 MG/GM VA CREA: TOPICAL_CREAM | VAGINAL | Status: AC

## 2012-07-21 NOTE — Telephone Encounter (Signed)
Pt called requesting refill on premarin vaginal cream, pt has annual scheduled 08/18/12. Refill will be sent to pharmacy

## 2012-08-18 ENCOUNTER — Other Ambulatory Visit: Payer: Self-pay | Admitting: Women's Health

## 2012-08-18 ENCOUNTER — Ambulatory Visit (INDEPENDENT_AMBULATORY_CARE_PROVIDER_SITE_OTHER): Payer: Medicare Other | Admitting: Women's Health

## 2012-08-18 ENCOUNTER — Encounter: Payer: Self-pay | Admitting: Women's Health

## 2012-08-18 VITALS — BP 122/74 | Ht 62.5 in | Wt 144.0 lb

## 2012-08-18 DIAGNOSIS — M810 Age-related osteoporosis without current pathological fracture: Secondary | ICD-10-CM

## 2012-08-18 NOTE — Progress Notes (Signed)
[name] February 28, 1945 [id number]    History:    The patient presents for Breast and pelvic exam. TAH 1992 fibroids and endometriosis. Anterior/posterior repair with sling 2004. Cystocele repair  08/2011 with good relief of symptoms. Osteoporosis, Fosamax for 2 years post hip fracture in 2001. Reclast  since 2010. Primary care manages hypercholesteremia, depression, hypothyroid. Mother with history of breast cancer. 2009 Pap VAIN with negative HR HPV normal Paps after. normal mammograms, had to have a ultrasound last  mammogram normal. Has not had a colonoscopy.  Past medical history, past surgical history, family history and social history were all reviewed and documented in the EPIC chart. 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   Exam:  Filed Vitals:   08/18/12 1022  BP: 122/74    General appearance:  Normal/scoliosis Head/Neck:  Normal, without cervical or supraclavicular adenopathy. Thyroid:  Symmetrical, normal in size, without palpable masses or nodularity. Respiratory  Effort:  Normal  Auscultation:  Clear without wheezing or rhonchi Cardiovascular  Auscultation:  Regular rate, without rubs, murmurs or gallops  Edema/varicosities:  Not grossly evident Abdominal  Soft,nontender, without masses, guarding or rebound.  Liver/spleen:  No organomegaly noted  Hernia:  None appreciated  Skin  Inspection:  Grossly normal  Palpation:  Grossly normal Neurologic/psychiatric  Orientation:  Normal with appropriate conversation.  Mood/affect:  Normal  Genitourinary    Breasts: Examined lying and sitting.     Right: Without masses, retractions, discharge or axillary adenopathy.     Left: Without masses, retractions, discharge or axillary adenopathy.   Inguinal/mons:  Normal without inguinal adenopathy  External genitalia:  Normal  BUS/Urethra/Skene's glands:  Normal  Bladder:  Normal  Vagina:  Normal  Cervix:  absent  Uterus:  absent  Adnexa/parametria:     Rt: Without  masses or tenderness.   Lt: Without masses or tenderness.  Anus and perineum: Normal  Digital rectal exam: Normal sphincter tone without palpated masses or tenderness  Assessment/Plan:  67 y.o. DWF G2 P2  for breast and pelvic exam.  TAH/no HRT. Hypercholesteremia/hypothyroid/depression-primary care labs and meds Osteoporosis with improvement on Reclast  Plan: Creatinine and calcium prior to Reclast,  we'll get scheduled. Reviewed this will be last year to complete 5 years. Home safety, fall prevention and importance of regular exercise reviewed. SBE's, continue annual mammogram, calcium rich diet, vitamin D 2000 daily encouraged. Reviewed importance of screening colonoscopy instructed to schedule. Current on vaccines.    Harrington Challenger [facility] [place], 5:21 PM 08/18/2012

## 2012-08-18 NOTE — Patient Instructions (Addendum)
Health Recommendations for Postmenopausal Women Respected and ongoing research has looked at the most common causes of death, disability, and poor quality of life in postmenopausal women. The causes include heart disease, diseases of blood vessels, diabetes, depression, cancer, and bone loss (osteoporosis). Many things can be done to help lower the chances of developing these and other common problems: CARDIOVASCULAR DISEASE Heart Disease: A heart attack is a medical emergency. Know the signs and symptoms of a heart attack. Below are things women can do to reduce their risk for heart disease.   Do not smoke. If you smoke, quit.  Aim for a healthy weight. Being overweight causes many preventable deaths. Eat a healthy and balanced diet and drink an adequate amount of liquids.  Get moving. Make a commitment to be more physically active. Aim for 30 minutes of activity on most, if not all days of the week.  Eat for heart health. Choose a diet that is low in saturated fat and cholesterol and eliminate trans fat. Include whole grains, vegetables, and fruits. Read and understand the labels on food containers before buying.  Know your numbers. Ask your caregiver to check your blood pressure, cholesterol (total, HDL, LDL, triglycerides) and blood glucose. Work with your caregiver on improving your entire clinical picture.  High blood pressure. Limit or stop your table salt intake (try salt substitute and food seasonings). Avoid salty foods and drinks. Read labels on food containers before buying. Eating well and exercising can help control high blood pressure. STROKE  Stroke is a medical emergency. Stroke may be the result of a blood clot in a blood vessel in the brain or by a brain hemorrhage (bleeding). Know the signs and symptoms of a stroke. To lower the risk of developing a stroke:  Avoid fatty foods.  Quit smoking.  Control your diabetes, blood pressure, and irregular heart rate. THROMBOPHLEBITIS  (BLOOD CLOT) OF THE LEG  Becoming overweight and leading a stationary lifestyle may also contribute to developing blood clots. Controlling your diet and exercising will help lower the risk of developing blood clots. CANCER SCREENING  Breast Cancer: Take steps to reduce your risk of breast cancer.  You should practice "breast self-awareness." This means understanding the normal appearance and feel of your breasts and should include breast self-examination. Any changes detected, no matter how small, should be reported to your caregiver.  After age 40, you should have a clinical breast exam (CBE) every year.  Starting at age 40, you should consider having a mammogram (breast X-ray) every year.  If you have a family history of breast cancer, talk to your caregiver about genetic screening.  If you are at high risk for breast cancer, talk to your caregiver about having an MRI and a mammogram every year.  Intestinal or Stomach Cancer: Tests to consider are a rectal exam, fecal occult blood, sigmoidoscopy, and colonoscopy. Women who are high risk may need to be screened at an earlier age and more often.  Cervical Cancer:  Beginning at age 30, you should have a Pap test every 3 years as long as the past 3 Pap tests have been normal.  If you have had past treatment for cervical cancer or a condition that could lead to cancer, you need Pap tests and screening for cancer for at least 20 years after your treatment.  If you had a hysterectomy for a problem that was not cancer or a condition that could lead to cancer, then you no longer need Pap tests.    If you are between ages 65 and 70, and you have had normal Pap tests going back 10 years, you no longer need Pap tests.  If Pap tests have been discontinued, risk factors (such as a new sexual partner) need to be reassessed to determine if screening should be resumed.  Some medical problems can increase the chance of getting cervical cancer. In these  cases, your caregiver may recommend more frequent screening and Pap tests.  Uterine Cancer: If you have vaginal bleeding after reaching menopause, you should notify your caregiver.  Ovarian cancer: Other than yearly pelvic exams, there are no reliable tests available to screen for ovarian cancer at this time except for yearly pelvic exams.  Lung Cancer: Yearly chest X-rays can detect lung cancer and should be done on high risk women, such as cigarette smokers and women with chronic lung disease (emphysema).  Skin Cancer: A complete body skin exam should be done at your yearly examination. Avoid overexposure to the sun and ultraviolet light lamps. Use a strong sun block cream when in the sun. All of these things are important in lowering the risk of skin cancer. MENOPAUSE Menopause Symptoms: Hormone therapy products are effective for treating symptoms associated with menopause:  Moderate to severe hot flashes.  Night sweats.  Mood swings.  Headaches.  Tiredness.  Loss of sex drive.  Insomnia.  Other symptoms. Hormone replacement carries certain risks, especially in older women. Women who use or are thinking about using estrogen or estrogen with progestin treatments should discuss that with their caregiver. Your caregiver will help you understand the benefits and risks. The ideal dose of hormone replacement therapy is not known. The Food and Drug Administration (FDA) has concluded that hormone therapy should be used only at the lowest doses and for the shortest amount of time to reach treatment goals.  OSTEOPOROSIS Protecting Against Bone Loss and Preventing Fracture: If you use hormone therapy for prevention of bone loss (osteoporosis), the risks for bone loss must outweigh the risk of the therapy. Ask your caregiver about other medications known to be safe and effective for preventing bone loss and fractures. To guard against bone loss or fractures, the following is recommended:  If  you are less than age 50, take 1000 mg of calcium and at least 600 mg of Vitamin D per day.  If you are greater than age 50 but less than age 70, take 1200 mg of calcium and at least 600 mg of Vitamin D per day.  If you are greater than age 70, take 1200 mg of calcium and at least 800 mg of Vitamin D per day. Smoking and excessive alcohol intake increases the risk of osteoporosis. Eat foods rich in calcium and vitamin D and do weight bearing exercises several times a week as your caregiver suggests. DIABETES Diabetes Melitus: If you have Type I or Type 2 diabetes, you should keep your blood sugar under control with diet, exercise and recommended medication. Avoid too many sweets, starchy and fatty foods. Being overweight can make control more difficult. COGNITION AND MEMORY Cognition and Memory: Menopausal hormone therapy is not recommended for the prevention of cognitive disorders such as Alzheimer's disease or memory loss.  DEPRESSION  Depression may occur at any age, but is common in elderly women. The reasons may be because of physical, medical, social (loneliness), or financial problems and needs. If you are experiencing depression because of medical problems and control of symptoms, talk to your caregiver about this. Physical activity and   exercise may help with mood and sleep. Community and volunteer involvement may help your sense of value and worth. If you have depression and you feel that the problem is getting worse or becoming severe, talk to your caregiver about treatment options that are best for you. ACCIDENTS  Accidents are common and can be serious in the elderly woman. Prepare your house to prevent accidents. Eliminate throw rugs, place hand bars in the bath, shower and toilet areas. Avoid wearing high heeled shoes or walking on wet, snowy, and icy areas. Limit or stop driving if you have vision or hearing problems, or you feel you are unsteady with you movements and  reflexes. HEPATITIS C Hepatitis C is a type of viral infection affecting the liver. It is spread mainly through contact with blood from an infected person. It can be treated, but if left untreated, it can lead to severe liver damage over years. Many people who are infected do not know that the virus is in their blood. If you are a "baby-boomer", it is recommended that you have one screening test for Hepatitis C. IMMUNIZATIONS  Several immunizations are important to consider having during your senior years, including:   Tetanus, diptheria, and pertussis booster shot.  Influenza every year before the flu season begins.  Pneumonia vaccine.  Shingles vaccine.  Others as indicated based on your specific needs. Talk to your caregiver about these. Document Released: 03/09/2005 Document Revised: 01/02/2012 Document Reviewed: 11/03/2007 Exit[employer] Patient Information 2014 [employer].  

## 2012-08-19 ENCOUNTER — Telehealth: Payer: Self-pay | Admitting: *Deleted

## 2012-08-19 NOTE — Telephone Encounter (Signed)
LM for pt to call back regarding Reclast benefits. KW

## 2012-08-19 NOTE — Telephone Encounter (Signed)
INformed of benefits, pt being responsible for 20% of the Reclast , pts part ~ $400. Pt is okay to proceed. Apt 7/28 at 10am at [facility] on 2nd floor at [facility]. Order faxed and instruction sheet mailed to pt. KW

## 2012-08-22 ENCOUNTER — Other Ambulatory Visit (HOSPITAL_COMMUNITY): Payer: Self-pay

## 2012-08-25 ENCOUNTER — Encounter (HOSPITAL_COMMUNITY)
Admission: RE | Admit: 2012-08-25 | Discharge: 2012-08-25 | Disposition: A | Discharge status: Home / Self Care | Payer: Medicare Other | Source: Ambulatory Visit | Attending: Gynecology | Admitting: Gynecology

## 2012-08-25 DIAGNOSIS — M810 Age-related osteoporosis without current pathological fracture: Secondary | ICD-10-CM | POA: Insufficient documentation

## 2012-08-25 MED ORDER — ZOLEDRONIC ACID 5 MG/100ML IV SOLN
5.0000 mg | Freq: Once | INTRAVENOUS | Status: AC
  Administered 2012-08-25: 5 mg via INTRAVENOUS

## 2012-08-25 MED ORDER — ZOLEDRONIC ACID 5 MG/100ML IV SOLN
INTRAVENOUS | Status: AC
  Filled 2012-08-25: qty 100

## 2012-09-12 ENCOUNTER — Other Ambulatory Visit: Payer: Self-pay | Admitting: *Deleted

## 2012-09-12 DIAGNOSIS — N6009 Solitary cyst of unspecified breast: Secondary | ICD-10-CM

## 2013-01-12 ENCOUNTER — Encounter: Payer: Self-pay | Admitting: Women's Health

## 2013-01-13 ENCOUNTER — Other Ambulatory Visit: Payer: Self-pay | Admitting: *Deleted

## 2013-01-13 ENCOUNTER — Encounter: Payer: Self-pay | Admitting: Women's Health

## 2013-01-13 DIAGNOSIS — N63 Unspecified lump in unspecified breast: Secondary | ICD-10-CM

## 2013-01-26 ENCOUNTER — Telehealth: Payer: Self-pay | Admitting: Women's Health

## 2013-01-26 NOTE — Telephone Encounter (Signed)
Reviewed request and bone density. Had a dose 2013, 2014 and per Dr. Delorise Royals note had 3 doses prior to 2013.

## 2013-11-30 ENCOUNTER — Encounter: Payer: Self-pay | Admitting: Women's Health

## 2015-04-27 DIAGNOSIS — E039 Hypothyroidism, unspecified: Secondary | ICD-10-CM | POA: Diagnosis not present

## 2015-04-27 DIAGNOSIS — E559 Vitamin D deficiency, unspecified: Secondary | ICD-10-CM | POA: Diagnosis not present

## 2015-04-27 DIAGNOSIS — Z79899 Other long term (current) drug therapy: Secondary | ICD-10-CM | POA: Diagnosis not present

## 2015-04-27 DIAGNOSIS — E7800 Pure hypercholesterolemia, unspecified: Secondary | ICD-10-CM | POA: Diagnosis not present

## 2015-06-01 DIAGNOSIS — F3181 Bipolar II disorder: Secondary | ICD-10-CM | POA: Diagnosis not present

## 2015-11-22 DIAGNOSIS — E039 Hypothyroidism, unspecified: Secondary | ICD-10-CM | POA: Diagnosis not present

## 2016-03-07 DIAGNOSIS — M9903 Segmental and somatic dysfunction of lumbar region: Secondary | ICD-10-CM | POA: Diagnosis not present

## 2016-03-07 DIAGNOSIS — M5136 Other intervertebral disc degeneration, lumbar region: Secondary | ICD-10-CM | POA: Diagnosis not present

## 2016-03-07 DIAGNOSIS — M9905 Segmental and somatic dysfunction of pelvic region: Secondary | ICD-10-CM | POA: Diagnosis not present

## 2016-03-07 DIAGNOSIS — M9904 Segmental and somatic dysfunction of sacral region: Secondary | ICD-10-CM | POA: Diagnosis not present

## 2016-03-26 DIAGNOSIS — M5136 Other intervertebral disc degeneration, lumbar region: Secondary | ICD-10-CM | POA: Diagnosis not present

## 2016-03-26 DIAGNOSIS — M9904 Segmental and somatic dysfunction of sacral region: Secondary | ICD-10-CM | POA: Diagnosis not present

## 2016-03-26 DIAGNOSIS — M9903 Segmental and somatic dysfunction of lumbar region: Secondary | ICD-10-CM | POA: Diagnosis not present

## 2016-03-26 DIAGNOSIS — M9905 Segmental and somatic dysfunction of pelvic region: Secondary | ICD-10-CM | POA: Diagnosis not present

## 2016-03-29 DIAGNOSIS — M5136 Other intervertebral disc degeneration, lumbar region: Secondary | ICD-10-CM | POA: Diagnosis not present

## 2016-03-29 DIAGNOSIS — M9904 Segmental and somatic dysfunction of sacral region: Secondary | ICD-10-CM | POA: Diagnosis not present

## 2016-03-29 DIAGNOSIS — M9905 Segmental and somatic dysfunction of pelvic region: Secondary | ICD-10-CM | POA: Diagnosis not present

## 2016-03-29 DIAGNOSIS — M9903 Segmental and somatic dysfunction of lumbar region: Secondary | ICD-10-CM | POA: Diagnosis not present

## 2016-03-30 DIAGNOSIS — Z0000 Encounter for general adult medical examination without abnormal findings: Secondary | ICD-10-CM | POA: Diagnosis not present

## 2016-03-30 DIAGNOSIS — M5136 Other intervertebral disc degeneration, lumbar region: Secondary | ICD-10-CM | POA: Diagnosis not present

## 2016-03-30 DIAGNOSIS — E7800 Pure hypercholesterolemia, unspecified: Secondary | ICD-10-CM | POA: Diagnosis not present

## 2016-03-30 DIAGNOSIS — M9904 Segmental and somatic dysfunction of sacral region: Secondary | ICD-10-CM | POA: Diagnosis not present

## 2016-03-30 DIAGNOSIS — M9905 Segmental and somatic dysfunction of pelvic region: Secondary | ICD-10-CM | POA: Diagnosis not present

## 2016-03-30 DIAGNOSIS — M9903 Segmental and somatic dysfunction of lumbar region: Secondary | ICD-10-CM | POA: Diagnosis not present

## 2016-03-30 DIAGNOSIS — M25551 Pain in right hip: Secondary | ICD-10-CM | POA: Diagnosis not present

## 2016-03-30 DIAGNOSIS — M8588 Other specified disorders of bone density and structure, other site: Secondary | ICD-10-CM | POA: Diagnosis not present

## 2016-03-30 DIAGNOSIS — F339 Major depressive disorder, recurrent, unspecified: Secondary | ICD-10-CM | POA: Diagnosis not present

## 2016-03-30 DIAGNOSIS — E039 Hypothyroidism, unspecified: Secondary | ICD-10-CM | POA: Diagnosis not present

## 2016-03-30 DIAGNOSIS — Z1211 Encounter for screening for malignant neoplasm of colon: Secondary | ICD-10-CM | POA: Diagnosis not present

## 2016-03-30 DIAGNOSIS — M4120 Other idiopathic scoliosis, site unspecified: Secondary | ICD-10-CM | POA: Diagnosis not present

## 2016-03-30 DIAGNOSIS — Z23 Encounter for immunization: Secondary | ICD-10-CM | POA: Diagnosis not present

## 2016-03-30 DIAGNOSIS — E559 Vitamin D deficiency, unspecified: Secondary | ICD-10-CM | POA: Diagnosis not present

## 2016-04-06 DIAGNOSIS — Z1211 Encounter for screening for malignant neoplasm of colon: Secondary | ICD-10-CM | POA: Diagnosis not present

## 2016-04-24 DIAGNOSIS — M810 Age-related osteoporosis without current pathological fracture: Secondary | ICD-10-CM | POA: Diagnosis not present

## 2016-04-24 DIAGNOSIS — M85832 Other specified disorders of bone density and structure, left forearm: Secondary | ICD-10-CM | POA: Diagnosis not present

## 2016-04-26 DIAGNOSIS — M25552 Pain in left hip: Secondary | ICD-10-CM | POA: Diagnosis not present

## 2016-04-26 DIAGNOSIS — M1612 Unilateral primary osteoarthritis, left hip: Secondary | ICD-10-CM | POA: Diagnosis not present

## 2016-04-26 DIAGNOSIS — S72002P Fracture of unspecified part of neck of left femur, subsequent encounter for closed fracture with malunion: Secondary | ICD-10-CM | POA: Diagnosis not present

## 2016-04-26 DIAGNOSIS — G8929 Other chronic pain: Secondary | ICD-10-CM | POA: Diagnosis not present

## 2016-09-10 DIAGNOSIS — S39012A Strain of muscle, fascia and tendon of lower back, initial encounter: Secondary | ICD-10-CM | POA: Diagnosis not present

## 2016-09-10 DIAGNOSIS — M9903 Segmental and somatic dysfunction of lumbar region: Secondary | ICD-10-CM | POA: Diagnosis not present

## 2016-09-10 DIAGNOSIS — M9901 Segmental and somatic dysfunction of cervical region: Secondary | ICD-10-CM | POA: Diagnosis not present

## 2016-09-10 DIAGNOSIS — S161XXA Strain of muscle, fascia and tendon at neck level, initial encounter: Secondary | ICD-10-CM | POA: Diagnosis not present

## 2016-09-18 DIAGNOSIS — F3181 Bipolar II disorder: Secondary | ICD-10-CM | POA: Diagnosis not present

## 2016-09-18 DIAGNOSIS — F902 Attention-deficit hyperactivity disorder, combined type: Secondary | ICD-10-CM | POA: Diagnosis not present

## 2016-09-18 DIAGNOSIS — S39012A Strain of muscle, fascia and tendon of lower back, initial encounter: Secondary | ICD-10-CM | POA: Diagnosis not present

## 2016-09-18 DIAGNOSIS — M9901 Segmental and somatic dysfunction of cervical region: Secondary | ICD-10-CM | POA: Diagnosis not present

## 2016-09-18 DIAGNOSIS — S161XXA Strain of muscle, fascia and tendon at neck level, initial encounter: Secondary | ICD-10-CM | POA: Diagnosis not present

## 2016-09-18 DIAGNOSIS — M9903 Segmental and somatic dysfunction of lumbar region: Secondary | ICD-10-CM | POA: Diagnosis not present

## 2016-09-25 DIAGNOSIS — M9901 Segmental and somatic dysfunction of cervical region: Secondary | ICD-10-CM | POA: Diagnosis not present

## 2016-09-25 DIAGNOSIS — S161XXA Strain of muscle, fascia and tendon at neck level, initial encounter: Secondary | ICD-10-CM | POA: Diagnosis not present

## 2016-09-25 DIAGNOSIS — S39012A Strain of muscle, fascia and tendon of lower back, initial encounter: Secondary | ICD-10-CM | POA: Diagnosis not present

## 2016-09-25 DIAGNOSIS — M9903 Segmental and somatic dysfunction of lumbar region: Secondary | ICD-10-CM | POA: Diagnosis not present

## 2016-10-11 DIAGNOSIS — T63441A Toxic effect of venom of bees, accidental (unintentional), initial encounter: Secondary | ICD-10-CM | POA: Diagnosis not present

## 2017-02-08 DIAGNOSIS — S39012A Strain of muscle, fascia and tendon of lower back, initial encounter: Secondary | ICD-10-CM | POA: Diagnosis not present

## 2017-02-08 DIAGNOSIS — M9903 Segmental and somatic dysfunction of lumbar region: Secondary | ICD-10-CM | POA: Diagnosis not present

## 2017-03-20 DIAGNOSIS — Z23 Encounter for immunization: Secondary | ICD-10-CM | POA: Diagnosis not present

## 2017-03-20 DIAGNOSIS — J209 Acute bronchitis, unspecified: Secondary | ICD-10-CM | POA: Diagnosis not present

## 2017-03-21 ENCOUNTER — Telehealth: Payer: Self-pay | Admitting: *Deleted

## 2017-03-21 NOTE — Telephone Encounter (Signed)
Pt called asking when the [facility], I called and spoke with patient and told her 08/25/12. Pt said she was having surgery and needed this information.

## 2017-04-04 DIAGNOSIS — E7800 Pure hypercholesterolemia, unspecified: Secondary | ICD-10-CM | POA: Diagnosis not present

## 2017-04-04 DIAGNOSIS — Z0000 Encounter for general adult medical examination without abnormal findings: Secondary | ICD-10-CM | POA: Diagnosis not present

## 2017-04-04 DIAGNOSIS — E559 Vitamin D deficiency, unspecified: Secondary | ICD-10-CM | POA: Diagnosis not present

## 2017-04-04 DIAGNOSIS — Z1211 Encounter for screening for malignant neoplasm of colon: Secondary | ICD-10-CM | POA: Diagnosis not present

## 2017-04-04 DIAGNOSIS — M4120 Other idiopathic scoliosis, site unspecified: Secondary | ICD-10-CM | POA: Diagnosis not present

## 2017-04-04 DIAGNOSIS — F319 Bipolar disorder, unspecified: Secondary | ICD-10-CM | POA: Diagnosis not present

## 2017-04-04 DIAGNOSIS — E039 Hypothyroidism, unspecified: Secondary | ICD-10-CM | POA: Diagnosis not present

## 2017-04-04 DIAGNOSIS — Z1389 Encounter for screening for other disorder: Secondary | ICD-10-CM | POA: Diagnosis not present

## 2017-04-04 DIAGNOSIS — F329 Major depressive disorder, single episode, unspecified: Secondary | ICD-10-CM | POA: Diagnosis not present

## 2017-04-04 DIAGNOSIS — M8588 Other specified disorders of bone density and structure, other site: Secondary | ICD-10-CM | POA: Diagnosis not present

## 2017-04-04 DIAGNOSIS — Z803 Family history of malignant neoplasm of breast: Secondary | ICD-10-CM | POA: Diagnosis not present

## 2017-04-08 DIAGNOSIS — H2512 Age-related nuclear cataract, left eye: Secondary | ICD-10-CM | POA: Diagnosis not present

## 2017-04-08 DIAGNOSIS — H2511 Age-related nuclear cataract, right eye: Secondary | ICD-10-CM | POA: Diagnosis not present

## 2017-04-09 DIAGNOSIS — H903 Sensorineural hearing loss, bilateral: Secondary | ICD-10-CM | POA: Diagnosis not present

## 2017-04-09 DIAGNOSIS — Z570 Occupational exposure to noise: Secondary | ICD-10-CM | POA: Diagnosis not present

## 2017-04-11 DIAGNOSIS — H25011 Cortical age-related cataract, right eye: Secondary | ICD-10-CM | POA: Diagnosis not present

## 2017-04-11 DIAGNOSIS — H2511 Age-related nuclear cataract, right eye: Secondary | ICD-10-CM | POA: Diagnosis not present

## 2017-04-11 DIAGNOSIS — H25811 Combined forms of age-related cataract, right eye: Secondary | ICD-10-CM | POA: Diagnosis not present

## 2017-04-23 DIAGNOSIS — M9903 Segmental and somatic dysfunction of lumbar region: Secondary | ICD-10-CM | POA: Diagnosis not present

## 2017-04-23 DIAGNOSIS — M5136 Other intervertebral disc degeneration, lumbar region: Secondary | ICD-10-CM | POA: Diagnosis not present

## 2017-04-24 DIAGNOSIS — F3181 Bipolar II disorder: Secondary | ICD-10-CM | POA: Diagnosis not present

## 2017-04-24 DIAGNOSIS — F902 Attention-deficit hyperactivity disorder, combined type: Secondary | ICD-10-CM | POA: Diagnosis not present

## 2017-05-01 DIAGNOSIS — H25012 Cortical age-related cataract, left eye: Secondary | ICD-10-CM | POA: Diagnosis not present

## 2017-05-02 DIAGNOSIS — H2512 Age-related nuclear cataract, left eye: Secondary | ICD-10-CM | POA: Diagnosis not present

## 2017-05-02 DIAGNOSIS — H25812 Combined forms of age-related cataract, left eye: Secondary | ICD-10-CM | POA: Diagnosis not present

## 2017-09-09 DIAGNOSIS — M9903 Segmental and somatic dysfunction of lumbar region: Secondary | ICD-10-CM | POA: Diagnosis not present

## 2017-09-09 DIAGNOSIS — S39012A Strain of muscle, fascia and tendon of lower back, initial encounter: Secondary | ICD-10-CM | POA: Diagnosis not present

## 2017-10-11 DIAGNOSIS — E039 Hypothyroidism, unspecified: Secondary | ICD-10-CM | POA: Diagnosis not present

## 2017-10-11 DIAGNOSIS — E7800 Pure hypercholesterolemia, unspecified: Secondary | ICD-10-CM | POA: Diagnosis not present

## 2017-10-11 DIAGNOSIS — E559 Vitamin D deficiency, unspecified: Secondary | ICD-10-CM | POA: Diagnosis not present

## 2017-10-11 DIAGNOSIS — F319 Bipolar disorder, unspecified: Secondary | ICD-10-CM | POA: Diagnosis not present

## 2017-10-11 DIAGNOSIS — F329 Major depressive disorder, single episode, unspecified: Secondary | ICD-10-CM | POA: Diagnosis not present

## 2017-10-11 DIAGNOSIS — Z1211 Encounter for screening for malignant neoplasm of colon: Secondary | ICD-10-CM | POA: Diagnosis not present

## 2018-04-02 DIAGNOSIS — F3181 Bipolar II disorder: Secondary | ICD-10-CM | POA: Diagnosis not present

## 2018-05-27 DIAGNOSIS — F329 Major depressive disorder, single episode, unspecified: Secondary | ICD-10-CM | POA: Diagnosis not present

## 2018-05-27 DIAGNOSIS — E039 Hypothyroidism, unspecified: Secondary | ICD-10-CM | POA: Diagnosis not present

## 2018-05-27 DIAGNOSIS — M4120 Other idiopathic scoliosis, site unspecified: Secondary | ICD-10-CM | POA: Diagnosis not present

## 2018-05-27 DIAGNOSIS — E7800 Pure hypercholesterolemia, unspecified: Secondary | ICD-10-CM | POA: Diagnosis not present

## 2018-05-27 DIAGNOSIS — E559 Vitamin D deficiency, unspecified: Secondary | ICD-10-CM | POA: Diagnosis not present

## 2018-07-15 DIAGNOSIS — E7800 Pure hypercholesterolemia, unspecified: Secondary | ICD-10-CM | POA: Diagnosis not present

## 2018-07-15 DIAGNOSIS — E559 Vitamin D deficiency, unspecified: Secondary | ICD-10-CM | POA: Diagnosis not present

## 2018-07-15 DIAGNOSIS — F329 Major depressive disorder, single episode, unspecified: Secondary | ICD-10-CM | POA: Diagnosis not present

## 2018-07-15 DIAGNOSIS — E039 Hypothyroidism, unspecified: Secondary | ICD-10-CM | POA: Diagnosis not present

## 2018-07-15 DIAGNOSIS — M4120 Other idiopathic scoliosis, site unspecified: Secondary | ICD-10-CM | POA: Diagnosis not present

## 2018-08-29 DIAGNOSIS — E039 Hypothyroidism, unspecified: Secondary | ICD-10-CM | POA: Diagnosis not present

## 2018-08-29 DIAGNOSIS — F329 Major depressive disorder, single episode, unspecified: Secondary | ICD-10-CM | POA: Diagnosis not present

## 2018-10-15 DIAGNOSIS — F3181 Bipolar II disorder: Secondary | ICD-10-CM | POA: Diagnosis not present

## 2018-10-15 DIAGNOSIS — F902 Attention-deficit hyperactivity disorder, combined type: Secondary | ICD-10-CM | POA: Diagnosis not present

## 2018-10-16 DIAGNOSIS — F329 Major depressive disorder, single episode, unspecified: Secondary | ICD-10-CM | POA: Diagnosis not present

## 2018-10-16 DIAGNOSIS — E039 Hypothyroidism, unspecified: Secondary | ICD-10-CM | POA: Diagnosis not present

## 2018-10-16 DIAGNOSIS — Z1211 Encounter for screening for malignant neoplasm of colon: Secondary | ICD-10-CM | POA: Diagnosis not present

## 2018-10-16 DIAGNOSIS — J309 Allergic rhinitis, unspecified: Secondary | ICD-10-CM | POA: Diagnosis not present

## 2018-10-16 DIAGNOSIS — E559 Vitamin D deficiency, unspecified: Secondary | ICD-10-CM | POA: Diagnosis not present

## 2018-10-16 DIAGNOSIS — M810 Age-related osteoporosis without current pathological fracture: Secondary | ICD-10-CM | POA: Diagnosis not present

## 2018-10-16 DIAGNOSIS — R634 Abnormal weight loss: Secondary | ICD-10-CM | POA: Diagnosis not present

## 2018-10-16 DIAGNOSIS — F319 Bipolar disorder, unspecified: Secondary | ICD-10-CM | POA: Diagnosis not present

## 2018-10-16 DIAGNOSIS — E7800 Pure hypercholesterolemia, unspecified: Secondary | ICD-10-CM | POA: Diagnosis not present

## 2019-01-13 DIAGNOSIS — M810 Age-related osteoporosis without current pathological fracture: Secondary | ICD-10-CM | POA: Diagnosis not present

## 2019-01-13 DIAGNOSIS — R634 Abnormal weight loss: Secondary | ICD-10-CM | POA: Diagnosis not present

## 2019-01-13 DIAGNOSIS — Z8262 Family history of osteoporosis: Secondary | ICD-10-CM | POA: Diagnosis not present

## 2019-01-29 DIAGNOSIS — F329 Major depressive disorder, single episode, unspecified: Secondary | ICD-10-CM | POA: Diagnosis not present

## 2019-01-29 DIAGNOSIS — M810 Age-related osteoporosis without current pathological fracture: Secondary | ICD-10-CM | POA: Diagnosis not present

## 2019-01-29 DIAGNOSIS — E7800 Pure hypercholesterolemia, unspecified: Secondary | ICD-10-CM | POA: Diagnosis not present

## 2019-01-29 DIAGNOSIS — E039 Hypothyroidism, unspecified: Secondary | ICD-10-CM | POA: Diagnosis not present

## 2019-02-23 DIAGNOSIS — M9901 Segmental and somatic dysfunction of cervical region: Secondary | ICD-10-CM | POA: Diagnosis not present

## 2019-02-23 DIAGNOSIS — M9904 Segmental and somatic dysfunction of sacral region: Secondary | ICD-10-CM | POA: Diagnosis not present

## 2019-02-23 DIAGNOSIS — M545 Low back pain: Secondary | ICD-10-CM | POA: Diagnosis not present

## 2019-02-23 DIAGNOSIS — M9902 Segmental and somatic dysfunction of thoracic region: Secondary | ICD-10-CM | POA: Diagnosis not present

## 2019-02-23 DIAGNOSIS — M810 Age-related osteoporosis without current pathological fracture: Secondary | ICD-10-CM | POA: Diagnosis not present

## 2019-02-23 DIAGNOSIS — M9903 Segmental and somatic dysfunction of lumbar region: Secondary | ICD-10-CM | POA: Diagnosis not present

## 2019-02-23 DIAGNOSIS — M542 Cervicalgia: Secondary | ICD-10-CM | POA: Diagnosis not present

## 2019-02-24 DIAGNOSIS — F3181 Bipolar II disorder: Secondary | ICD-10-CM | POA: Diagnosis not present

## 2019-02-24 DIAGNOSIS — F902 Attention-deficit hyperactivity disorder, combined type: Secondary | ICD-10-CM | POA: Diagnosis not present

## 2019-03-02 DIAGNOSIS — M545 Low back pain: Secondary | ICD-10-CM | POA: Diagnosis not present

## 2019-03-02 DIAGNOSIS — M9903 Segmental and somatic dysfunction of lumbar region: Secondary | ICD-10-CM | POA: Diagnosis not present

## 2019-03-02 DIAGNOSIS — M542 Cervicalgia: Secondary | ICD-10-CM | POA: Diagnosis not present

## 2019-03-02 DIAGNOSIS — M9901 Segmental and somatic dysfunction of cervical region: Secondary | ICD-10-CM | POA: Diagnosis not present

## 2019-03-02 DIAGNOSIS — M9904 Segmental and somatic dysfunction of sacral region: Secondary | ICD-10-CM | POA: Diagnosis not present

## 2019-03-02 DIAGNOSIS — M9902 Segmental and somatic dysfunction of thoracic region: Secondary | ICD-10-CM | POA: Diagnosis not present

## 2019-03-09 DIAGNOSIS — M542 Cervicalgia: Secondary | ICD-10-CM | POA: Diagnosis not present

## 2019-03-09 DIAGNOSIS — M545 Low back pain: Secondary | ICD-10-CM | POA: Diagnosis not present

## 2019-03-09 DIAGNOSIS — M9901 Segmental and somatic dysfunction of cervical region: Secondary | ICD-10-CM | POA: Diagnosis not present

## 2019-03-09 DIAGNOSIS — M9902 Segmental and somatic dysfunction of thoracic region: Secondary | ICD-10-CM | POA: Diagnosis not present

## 2019-03-09 DIAGNOSIS — M9903 Segmental and somatic dysfunction of lumbar region: Secondary | ICD-10-CM | POA: Diagnosis not present

## 2019-03-09 DIAGNOSIS — M9904 Segmental and somatic dysfunction of sacral region: Secondary | ICD-10-CM | POA: Diagnosis not present

## 2019-03-14 ENCOUNTER — Ambulatory Visit: Payer: PPO | Attending: Internal Medicine

## 2019-03-14 DIAGNOSIS — Z23 Encounter for immunization: Secondary | ICD-10-CM | POA: Insufficient documentation

## 2019-03-14 NOTE — Progress Notes (Signed)
   Covid-19 Vaccination Clinic  Name:  [name]    MRN: [id number] DOB: Jan 27, 1946  03/14/2019  [name] was observed post Covid-19 immunization for 15 minutes without incidence. She was provided with Vaccine Information Sheet and instruction to access the V-Safe system.   [name] was instructed to call 911 with any severe reactions post vaccine: Marland Kitchen Difficulty breathing  . Swelling of your face and throat  . A fast heartbeat  . A bad rash all over your body  . Dizziness and weakness    Immunizations Administered    Name Date Dose VIS Date Route   Pfizer COVID-19 Vaccine 03/14/2019 10:40 AM 0.3 mL 01/09/2019 Intramuscular   Manufacturer: [employer], [employer]   Lot: [id number]   NDC: [address]-5

## 2019-03-23 DIAGNOSIS — M9902 Segmental and somatic dysfunction of thoracic region: Secondary | ICD-10-CM | POA: Diagnosis not present

## 2019-03-23 DIAGNOSIS — M9903 Segmental and somatic dysfunction of lumbar region: Secondary | ICD-10-CM | POA: Diagnosis not present

## 2019-03-23 DIAGNOSIS — M9904 Segmental and somatic dysfunction of sacral region: Secondary | ICD-10-CM | POA: Diagnosis not present

## 2019-03-23 DIAGNOSIS — M9901 Segmental and somatic dysfunction of cervical region: Secondary | ICD-10-CM | POA: Diagnosis not present

## 2019-03-23 DIAGNOSIS — M542 Cervicalgia: Secondary | ICD-10-CM | POA: Diagnosis not present

## 2019-03-23 DIAGNOSIS — M545 Low back pain: Secondary | ICD-10-CM | POA: Diagnosis not present

## 2019-03-25 DIAGNOSIS — M810 Age-related osteoporosis without current pathological fracture: Secondary | ICD-10-CM | POA: Diagnosis not present

## 2019-03-25 DIAGNOSIS — E7800 Pure hypercholesterolemia, unspecified: Secondary | ICD-10-CM | POA: Diagnosis not present

## 2019-03-25 DIAGNOSIS — F329 Major depressive disorder, single episode, unspecified: Secondary | ICD-10-CM | POA: Diagnosis not present

## 2019-03-25 DIAGNOSIS — E039 Hypothyroidism, unspecified: Secondary | ICD-10-CM | POA: Diagnosis not present

## 2019-04-05 ENCOUNTER — Ambulatory Visit: Payer: PPO | Attending: Internal Medicine

## 2019-04-05 ENCOUNTER — Other Ambulatory Visit: Payer: Self-pay

## 2019-04-05 DIAGNOSIS — Z23 Encounter for immunization: Secondary | ICD-10-CM | POA: Insufficient documentation

## 2019-04-05 NOTE — Progress Notes (Signed)
   Covid-19 Vaccination Clinic  Name:  [name]    MRN: [id number] DOB: 08-01-1945  04/05/2019  [name] was observed post Covid-19 immunization for 30 minutes based on pre-vaccination screening without incident. She was provided with Vaccine Information Sheet and instruction to access the V-Safe system.   [name] was instructed to call 911 with any severe reactions post vaccine: Marland Kitchen Difficulty breathing  . Swelling of face and throat  . A fast heartbeat  . A bad rash all over body  . Dizziness and weakness   Immunizations Administered    Name Date Dose VIS Date Route   Pfizer COVID-19 Vaccine 04/05/2019  3:21 PM 0.3 mL 01/09/2019 Intramuscular   Manufacturer: [employer], [employer]   Lot: [id number]   NDC: [address]-1

## 2019-04-07 ENCOUNTER — Ambulatory Visit: Payer: PPO | Admitting: Internal Medicine

## 2019-04-07 ENCOUNTER — Telehealth: Payer: Self-pay | Admitting: Internal Medicine

## 2019-04-07 ENCOUNTER — Encounter: Payer: Self-pay | Admitting: Internal Medicine

## 2019-04-07 ENCOUNTER — Other Ambulatory Visit: Payer: Self-pay

## 2019-04-07 VITALS — BP 124/84 | HR 73 | Temp 98.3°F | Ht 62.0 in | Wt 121.4 lb

## 2019-04-07 DIAGNOSIS — E039 Hypothyroidism, unspecified: Secondary | ICD-10-CM

## 2019-04-07 DIAGNOSIS — M810 Age-related osteoporosis without current pathological fracture: Secondary | ICD-10-CM | POA: Diagnosis not present

## 2019-04-07 LAB — T4, FREE: Free T4: 1.15 ng/dL (ref 0.60–1.60)

## 2019-04-07 LAB — BASIC METABOLIC PANEL
BUN: 13 mg/dL (ref 6–23)
CO2: 29 mEq/L (ref 19–32)
Calcium: 9.8 mg/dL (ref 8.4–10.5)
Chloride: 99 mEq/L (ref 96–112)
Creatinine, Ser: 0.73 mg/dL (ref 0.40–1.20)
GFR: 77.79 mL/min (ref >60.00)
Glucose, Bld: 77 mg/dL (ref 70–99)
Potassium: 4 mEq/L (ref 3.5–5.1)
Sodium: 134 mEq/L — ABNORMAL LOW (ref 135–145)

## 2019-04-07 LAB — TSH: TSH: 1.07 u[IU]/mL (ref 0.35–4.50)

## 2019-04-07 LAB — VITAMIN D 25 HYDROXY (VIT D DEFICIENCY, FRACTURES): VITD: 29.62 ng/mL — ABNORMAL LOW (ref 30.00–100.00)

## 2019-04-07 NOTE — Patient Instructions (Signed)
-   Calcium 600 mg twice daily  - Vitamin D 5000 iu , three times weekly    - You are on levothyroxine - which is your thyroid hormone supplement. You MUST take this consistently.  You should take this first thing in the morning on an empty stomach with water. You should not take it with other medications. Wait to 1hr prior to eating. If you are taking any vitamins - please take these in the evening.   If you miss a dose, please take your missed dose the following day (double the dose for that day). You should have a pill box for ONLY levothyroxine on your bedside table to help you remember to take your medications.

## 2019-04-07 NOTE — Telephone Encounter (Signed)
Pt has been submitted to the portal Awaiting SOB

## 2019-04-07 NOTE — Progress Notes (Signed)
Name: [name]  MRN/ DOB: [id number], 19-Nov-1945    Age/ Sex: 74 y.o., female    PCP: Leighton Ruff, MD   Reason for Endocrinology Evaluation: Osteoporosis      Date of Initial Endocrinology Evaluation: 04/08/2019     HPI: [name] is a 74 y.o. female with a past medical history of Dyslipidemia and osteoporosis . The patient presented for initial endocrinology clinic visit on 04/08/2019 for consultative assistance with her Osteoporosis    Pt was diagnosed with osteoporosis: 2007 (right femoral neck  T score  -3.2)   Menarche at age : 10 Menopausal at age : 50's  Fracture Hx: Left humerus- tripped. Left hip- tripped  Hx of HRT: Yes - does not recall for how long  FH of osteoporosis or hip fracture: Grandmother  Prior Hx of anti-estrogenic therapy : no  Prior Hx of anti-resorptive therapy : Fosamax, reclast > 6 yrs    Vitamin D3 5000 take it 3x a week  Does not take calcium , eats 1 servings of calcium a day   She is having hair loss Denies constipation Has a bipolar disorder with a lot of depression   Of note the patient has been diagnosed with hypothyroidism many years ago, she would like for me to address her thyroid disease today, patient is not sure what the dose of levothyroxine is as we have 2 doses for her 100 MCG and 88 MCG.  Patient does complain of hair loss, but other than that she is euthyroid.     HISTORY:  Past Medical History:  Past Medical History:  Diagnosis Date  . Anxiety   . Arthritis   . Bipolar disorder ([place])   . Cystocele   . Depression   . DUB (dysfunctional uterine bleeding)   . Hyperlipidemia   . Hypothyroidism   . LGSIL (low grade squamous intraepithelial dysplasia)   . Osteoporosis   . Pelvic relaxation due to vaginal prolapse   . Scoliosis   . VAIN I (vaginal intraepithelial neoplasia grade I)    Past Surgical History:  Past Surgical History:  Procedure Laterality Date  . ABDOMINAL HYSTERECTOMY  1992   AND  APPENDECTOMY  . ANTERIOR AND POSTERIOR VAGINAL REPAIR     With sling  . CLOSED REDUCTION AND PINNING LEFT HIP FEMORAL NECK FX  01-15-2000   REMOVAL HALF OF HARDWARE IN 2004  . COLPOSCOPY    . HEMORRHOID SURGERY  09-09-2007  . LASER ABLATION OF THE CERVIX    . PUBOVAGINAL SLING  09/17/2011   Procedure: Gaynelle Arabian;  Surgeon: Ailene Rud, MD;  Location: [facility];  Service: Urology;  Laterality: N/A;  UPHOLD SACRAL SPINUS FIXATION OWER TO MAIN [employer] REP TO BE PRESENT, PAM WILL COORDINATE  . TUBAL LIGATION  1981      Social History:  reports that she quit smoking about 18 years ago. Her smoking use included cigarettes. She has a 35.00 pack-year smoking history. She has never used smokeless tobacco. She reports current alcohol use. She reports that she does not use drugs.  Family History: family history includes Breast cancer in her mother; Cancer in her sister.   HOME MEDICATIONS: Allergies as of 04/07/2019      Reactions   Darvocet [propoxyphene N-acetaminophen] Hives   Other Hives   Tetanus Antitoxin Swelling   Lidocaine-epinephrine Palpitations      Medication List       Accurate as of April 07, 2019 11:59  PM. If you have any questions, ask your nurse or doctor.        ALPRAZolam 0.5 MG tablet Commonly known as: XANAX Take 0.5 mg by mouth every morning. 3 times a day PRN   amphetamine-dextroamphetamine 10 MG tablet Commonly known as: ADDERALL Take 10 mg by mouth 2 (two) times daily.   atorvastatin 20 MG tablet Commonly known as: LIPITOR Take 20 mg by mouth daily.   citalopram 40 MG tablet Commonly known as: CELEXA Take by mouth.   conjugated estrogens vaginal cream Commonly known as: PREMARIN Place 1 gram vaginally at bedtime 2 times per week.   cyclobenzaprine 10 MG tablet Commonly known as: FLEXERIL Take 10 mg by mouth 3 (three) times daily as needed.   fluticasone 50 MCG/ACT nasal spray Commonly known as:  FLONASE Place 2 sprays into the nose every morning.   Glucosamine-Chondroitin 1500-1200 MG/30ML Liqd Take by mouth daily.   levothyroxine 88 MCG tablet Commonly known as: SYNTHROID Take 88 mcg by mouth every morning.   meloxicam 15 MG tablet Commonly known as: MOBIC TAKE 1 TABLET ONCE A DAY ORALLY   simvastatin 20 MG tablet Commonly known as: ZOCOR Take 20 mg by mouth every morning.   tiZANidine 2 MG tablet Commonly known as: ZANAFLEX Take 2 mg by mouth 3 (three) times daily as needed.   traZODone 50 MG tablet Commonly known as: DESYREL Take 50 mg by mouth as needed.   Vitamin D3 250 MCG (10000 UT) capsule Take 10,000 Units by mouth daily.         REVIEW OF SYSTEMS: A comprehensive ROS was conducted with the patient and is negative except as per HPI and below:  ROS     OBJECTIVE:  VS: BP 124/84 (BP Location: Left Arm, Patient Position: Sitting, Cuff Size: Normal)   Pulse 73   Temp 98.3 F (36.8 C)   Ht 5\' 2"  (1.575 m)   Wt 121 lb 6.4 oz (55.1 kg)   SpO2 93%   BMI 22.20 kg/m    Wt Readings from Last 3 Encounters:  04/07/19 121 lb 6.4 oz (55.1 kg)  08/25/12 145 lb (65.8 kg)  08/18/12 144 lb (65.3 kg)     EXAM: General: Pt appears well and is in NAD  Neck: General: Supple without adenopathy. Thyroid: Thyroid size normal.  No goiter or nodules appreciated. No thyroid bruit.  Lungs: Clear with good BS bilat with no rales, rhonchi, or wheezes  Heart: Auscultation: RRR.  Abdomen: Normoactive bowel sounds, soft, nontender, without masses or organomegaly palpable  Extremities:  BL LE: No pretibial edema normal ROM and strength.  Skin: Hair: Texture and amount normal with gender appropriate distribution Skin Inspection: No rashes Skin Palpation: Skin temperature, texture, and thickness normal to palpation  Neuro: Cranial nerves: II - XII grossly intact  Motor: Normal strength throughout DTRs: 2+ and symmetric in UE without delay in relaxation phase   Mental Status: Judgment, insight: Intact Orientation: Oriented to time, place, and person Mood and affect: No depression, anxiety, or agitation     DATA REVIEWED: Results for LEIGHTON, LUSTER (MRN Marshell Levan) as of 04/08/2019 13:53  Ref. Range 04/07/2019 14:52  Sodium Latest Ref Range: 135 - 145 mEq/L 134 (L)  Potassium Latest Ref Range: 3.5 - 5.1 mEq/L 4.0  Chloride Latest Ref Range: 96 - 112 mEq/L 99  CO2 Latest Ref Range: 19 - 32 mEq/L 29  Glucose Latest Ref Range: 70 - 99 mg/dL 77  BUN Latest Ref Range: 6 -  23 mg/dL 13  Creatinine Latest Ref Range: 0.40 - 1.20 mg/dL 4.70  Calcium Latest Ref Range: 8.4 - 10.5 mg/dL 9.8  GFR Latest Ref Range: >60.00 mL/min 77.79  VITD Latest Ref Range: 30.00 - 100.00 ng/mL 29.62 (L)  TSH Latest Ref Range: 0.35 - 4.50 uIU/mL 1.07  T4,Free(Direct) Latest Ref Range: 0.60 - 1.60 ng/dL 9.62      8366 (Lunar) Change from previous 2014 Loss adjuster, chartered) Change from previous 2018 [facility]) Change from prior 01/13/2019 Change from Prior  RFN -2.3  -2.0  -2.6 Down 9% -2.8 Down 2%  Right total  -1.9 Up 5.9%* -1.8 Up 1.3% -1.8 n/a -2.3 n/a  LFN 2.4  2.3  0.60 Down 20% -0.2 Down 10%  Left total 0.1 n/a 0.2 Up 1.5% -0.8 n/a -1.4 n/a  1/3 forearm -1.3  -0.8 Up 4.6% -1.2 n/a -1.8 Down 6%  * significant change   ASSESSMENT/PLAN/RECOMMENDATIONS:   1. Osteoporosis:   -We discussed the importance of optimal calcium intake and vitamin D intake, she was recently started on vitamin D but has not taken calcium in years. -She has been on Fosamax in the past with failure to therapy per patient, she was also on zoledronic acid, patient did not have any intolerance issues, she has been off zoledronic acid for> 6 years, patient does not recall the exact year she has been on these medications. -We have reviewed her bone density from 2007.  It seems like 2012 and 2014 her bone density has improved, but starting in 2018 her bone density has shown a gradual decrease. -I have recommended  Prolia due to better outcomes and comparisons with bisphosphonates alone.  We did discuss the rebound phenomena with Prolia if not finished with a bisphosphonate at the end of 3 years, if we decide to proceed with drug therapy at the time.   -I have encouraged the patient to start calcium 600 mg twice daily -She recently started vitamin D3 (liquid) 5000 iu 3 times weekly -Her information has been submitted to the Prolia portal    2. Hypothyroidism:   -Patient is clinically euthyroid -No local neck symptoms -Pt educated extensively on the correct way to take levothyroxine (first thing in the morning with water, 30 minutes before eating or taking other medications). - Pt encouraged to double dose the following day if she were to miss a dose given long half-life of levothyroxine. -Repeat TFTs today are normal -Patient will continue to follow-up with PCP for this    Follow-up in 6 months  Signed electronically by: Lyndle Herrlich, MD  [facility] Endocrinology  [facility] [address] [place] [place], [place] [address] Phone: [phone] FAX: [phone]   CC: Juluis Rainier, MD [address] [place] [address] Phone: [phone] Fax: [phone]   Return to Endocrinology clinic as below: Future Appointments  Date Time Provider Department Center  10/12/2019  1:20 PM Kori Goins, Konrad Dolores, MD [facility] None

## 2019-04-07 NOTE — Telephone Encounter (Signed)
-----   Message from Orland Penman, MD sent at 04/07/2019  2:51 PM EST ----- Please set this pt up for Prolia     Thanks

## 2019-04-08 ENCOUNTER — Encounter: Payer: Self-pay | Admitting: Internal Medicine

## 2019-04-08 DIAGNOSIS — M810 Age-related osteoporosis without current pathological fracture: Secondary | ICD-10-CM | POA: Insufficient documentation

## 2019-04-08 DIAGNOSIS — E039 Hypothyroidism, unspecified: Secondary | ICD-10-CM | POA: Insufficient documentation

## 2019-04-14 DIAGNOSIS — F319 Bipolar disorder, unspecified: Secondary | ICD-10-CM | POA: Diagnosis not present

## 2019-04-14 DIAGNOSIS — F329 Major depressive disorder, single episode, unspecified: Secondary | ICD-10-CM | POA: Diagnosis not present

## 2019-04-14 DIAGNOSIS — M810 Age-related osteoporosis without current pathological fracture: Secondary | ICD-10-CM | POA: Diagnosis not present

## 2019-04-14 DIAGNOSIS — E039 Hypothyroidism, unspecified: Secondary | ICD-10-CM | POA: Diagnosis not present

## 2019-04-14 DIAGNOSIS — E7800 Pure hypercholesterolemia, unspecified: Secondary | ICD-10-CM | POA: Diagnosis not present

## 2019-05-07 DIAGNOSIS — E7800 Pure hypercholesterolemia, unspecified: Secondary | ICD-10-CM | POA: Diagnosis not present

## 2019-05-07 DIAGNOSIS — F319 Bipolar disorder, unspecified: Secondary | ICD-10-CM | POA: Diagnosis not present

## 2019-05-07 DIAGNOSIS — M810 Age-related osteoporosis without current pathological fracture: Secondary | ICD-10-CM | POA: Diagnosis not present

## 2019-05-07 DIAGNOSIS — F329 Major depressive disorder, single episode, unspecified: Secondary | ICD-10-CM | POA: Diagnosis not present

## 2019-05-07 DIAGNOSIS — E039 Hypothyroidism, unspecified: Secondary | ICD-10-CM | POA: Diagnosis not present

## 2019-05-26 DIAGNOSIS — M542 Cervicalgia: Secondary | ICD-10-CM | POA: Diagnosis not present

## 2019-05-30 DIAGNOSIS — H6123 Impacted cerumen, bilateral: Secondary | ICD-10-CM | POA: Diagnosis not present

## 2019-06-15 NOTE — Telephone Encounter (Signed)
Attempted to contact pt and inform her that her insurance does not require a PA for Prolia, but she is responsible for 20% of the cost of the medication and administration fee. This cost is $503. Pt is eligible for the Prolia commercial copay card in which pt can get the medication for $25 or less.  Pt did not answer the phone and she does not have a voicemail. Call was ended and stated "the person you are trying to call has a voicemail box that has not been set up yet. Please hang up and try your call again later."  Will attempt to call again.

## 2019-06-24 NOTE — Telephone Encounter (Signed)
Attempted to call pt again and she did not answer. Pt does not have a voicemail box. This is the second attempt made.

## 2019-06-25 DIAGNOSIS — E7800 Pure hypercholesterolemia, unspecified: Secondary | ICD-10-CM | POA: Diagnosis not present

## 2019-06-25 DIAGNOSIS — F319 Bipolar disorder, unspecified: Secondary | ICD-10-CM | POA: Diagnosis not present

## 2019-06-25 DIAGNOSIS — E039 Hypothyroidism, unspecified: Secondary | ICD-10-CM | POA: Diagnosis not present

## 2019-06-25 DIAGNOSIS — F329 Major depressive disorder, single episode, unspecified: Secondary | ICD-10-CM | POA: Diagnosis not present

## 2019-06-25 DIAGNOSIS — M810 Age-related osteoporosis without current pathological fracture: Secondary | ICD-10-CM | POA: Diagnosis not present

## 2019-06-30 ENCOUNTER — Telehealth: Payer: Self-pay | Admitting: Internal Medicine

## 2019-06-30 NOTE — Telephone Encounter (Signed)
Patient requests to be called at [phone] to discuss getting Prolia.

## 2019-07-01 NOTE — Telephone Encounter (Signed)
Please contact pt.

## 2019-07-01 NOTE — Telephone Encounter (Signed)
Called pt back and she stated that she has previously taken Reclast and she did well with it. She is concerned about Prolia and its cost, as well as the information she read, which states that if she quits taking it, it can cause spinal fractures. She is not sure she wants to proceed with Prolia.

## 2019-07-02 NOTE — Telephone Encounter (Signed)
Called pt and she stated that she was at the vets office and she requested that I contact her at a later time.

## 2019-07-02 NOTE — Telephone Encounter (Signed)
Called pt and spoke with her again as well as read Dr. Vito Berger message to her. Pt did gain a better understanding of the plan of treatment, and she decided to move forward with the Prolia injection. 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  She was given the phone number to call and enroll in the program, and then she will contact this office once this is complete for further instruction. Pt wanted to proceed with one step at a time because she stated that there is no way she would remember the entire process. Will await a return phone call from the pt.

## 2019-07-13 NOTE — Telephone Encounter (Signed)
Called pt to follow up on the status of her contacting the Prolia co-pay number she was given last week. Pt stated that as of right now, she still has not made the phone call because her grand daughter is in town from out of state, and she has been visiting with her.

## 2019-08-12 DIAGNOSIS — F331 Major depressive disorder, recurrent, moderate: Secondary | ICD-10-CM | POA: Diagnosis not present

## 2019-08-13 DIAGNOSIS — E7800 Pure hypercholesterolemia, unspecified: Secondary | ICD-10-CM | POA: Diagnosis not present

## 2019-08-13 DIAGNOSIS — E039 Hypothyroidism, unspecified: Secondary | ICD-10-CM | POA: Diagnosis not present

## 2019-08-13 DIAGNOSIS — M810 Age-related osteoporosis without current pathological fracture: Secondary | ICD-10-CM | POA: Diagnosis not present

## 2019-08-13 DIAGNOSIS — F329 Major depressive disorder, single episode, unspecified: Secondary | ICD-10-CM | POA: Diagnosis not present

## 2019-08-13 DIAGNOSIS — F319 Bipolar disorder, unspecified: Secondary | ICD-10-CM | POA: Diagnosis not present

## 2019-08-24 NOTE — Telephone Encounter (Signed)
Pt returned phone call and stated that after reading more into it, she has decided not to proceed with Prolia because of the risk of bone fractures after discontinuation. Pt would like to do Reclast instead. Pt stated that she has done this in the past, and she did fine with it.

## 2019-08-25 ENCOUNTER — Other Ambulatory Visit: Payer: Self-pay | Admitting: Internal Medicine

## 2019-08-25 DIAGNOSIS — M810 Age-related osteoporosis without current pathological fracture: Secondary | ICD-10-CM

## 2019-08-25 NOTE — Telephone Encounter (Signed)
Will verify pt for reclast.

## 2019-08-25 NOTE — Telephone Encounter (Signed)
Called pt's insurance to verify whether Reclast medication and infusion process would be covered under hospital billing procedures when done at an outpatient infusion clinic within the hospital. Spoke with rep named Eliberto Ivory D. Call reference number was [phone]. Eliberto Ivory stated that no PA was required, and both medication and procedure are covered at 20% coinsurance. Dr. Lonzo Cloud, could you please order a BMP and pt will be contacted to get this blood work?

## 2019-08-26 NOTE — Telephone Encounter (Signed)
Attempted to call pt and schedule this blood work to be done. Pt did not answer, and did not have a voicemailbox set up at this time.

## 2019-08-27 NOTE — Telephone Encounter (Signed)
Called pt and scheduled her for labs prior to Reclast infusion.

## 2019-08-31 ENCOUNTER — Other Ambulatory Visit: Payer: PPO

## 2019-09-02 DIAGNOSIS — E7800 Pure hypercholesterolemia, unspecified: Secondary | ICD-10-CM | POA: Diagnosis not present

## 2019-09-02 DIAGNOSIS — M810 Age-related osteoporosis without current pathological fracture: Secondary | ICD-10-CM | POA: Diagnosis not present

## 2019-09-02 DIAGNOSIS — F329 Major depressive disorder, single episode, unspecified: Secondary | ICD-10-CM | POA: Diagnosis not present

## 2019-09-02 DIAGNOSIS — F319 Bipolar disorder, unspecified: Secondary | ICD-10-CM | POA: Diagnosis not present

## 2019-09-02 DIAGNOSIS — E039 Hypothyroidism, unspecified: Secondary | ICD-10-CM | POA: Diagnosis not present

## 2019-09-08 NOTE — Telephone Encounter (Signed)
Attempted to call pt and reschedule the lab appt that was canceled. Pt did not answer, and phone continued to ring without diverting to voicemail box.

## 2019-09-10 NOTE — Telephone Encounter (Signed)
Please advise upon your return. Pt has already denied Prolia, now she is denying Reclast. How would you like to proceed for pt's treatment?

## 2019-09-10 NOTE — Telephone Encounter (Signed)
Called pt to inform about Dr. Harvel Ricks response below. Phone rang continuously then disconnected. No VM.

## 2019-09-10 NOTE — Telephone Encounter (Signed)
Pt can call us to discuss alternative options after her dental work up is done. Until then she will be at high risk for fractures and it's associated morbidity

## 2019-09-10 NOTE — Telephone Encounter (Signed)
Would you please contact this pt and make her aware?

## 2019-09-10 NOTE — Telephone Encounter (Signed)
Patient called today stating that she has to have dental work done and will have to hold off on the Recast for now-she would like a call back if another treatment option will be recommended to replace Reclast

## 2019-09-11 NOTE — Telephone Encounter (Signed)
Attempted to contact pt and inform her of Dr. Harvel Ricks message. Pt did not answer, and after a minute or so, the phone line began beeping as if the line was busy. Will make one additional attempt and then letter will be sent.

## 2019-09-15 NOTE — Telephone Encounter (Signed)
Attempted to contact pt again. Pt did not answer. A letter will be sent to the pt with Dr. Harvel Ricks message.

## 2019-09-17 NOTE — Telephone Encounter (Signed)
Letter has been placed in the mail 

## 2019-10-12 ENCOUNTER — Ambulatory Visit: Payer: PPO | Admitting: Internal Medicine

## 2019-10-27 DIAGNOSIS — E039 Hypothyroidism, unspecified: Secondary | ICD-10-CM | POA: Diagnosis not present

## 2019-10-27 DIAGNOSIS — E7800 Pure hypercholesterolemia, unspecified: Secondary | ICD-10-CM | POA: Diagnosis not present

## 2019-10-27 DIAGNOSIS — M810 Age-related osteoporosis without current pathological fracture: Secondary | ICD-10-CM | POA: Diagnosis not present

## 2019-10-27 DIAGNOSIS — F329 Major depressive disorder, single episode, unspecified: Secondary | ICD-10-CM | POA: Diagnosis not present

## 2019-10-27 DIAGNOSIS — F319 Bipolar disorder, unspecified: Secondary | ICD-10-CM | POA: Diagnosis not present

## 2019-12-17 DIAGNOSIS — F329 Major depressive disorder, single episode, unspecified: Secondary | ICD-10-CM | POA: Diagnosis not present

## 2019-12-17 DIAGNOSIS — E7800 Pure hypercholesterolemia, unspecified: Secondary | ICD-10-CM | POA: Diagnosis not present

## 2019-12-17 DIAGNOSIS — E039 Hypothyroidism, unspecified: Secondary | ICD-10-CM | POA: Diagnosis not present

## 2019-12-17 DIAGNOSIS — F319 Bipolar disorder, unspecified: Secondary | ICD-10-CM | POA: Diagnosis not present

## 2019-12-17 DIAGNOSIS — M810 Age-related osteoporosis without current pathological fracture: Secondary | ICD-10-CM | POA: Diagnosis not present

## 2020-01-11 DIAGNOSIS — F319 Bipolar disorder, unspecified: Secondary | ICD-10-CM | POA: Diagnosis not present

## 2020-01-11 DIAGNOSIS — E039 Hypothyroidism, unspecified: Secondary | ICD-10-CM | POA: Diagnosis not present

## 2020-01-11 DIAGNOSIS — E7800 Pure hypercholesterolemia, unspecified: Secondary | ICD-10-CM | POA: Diagnosis not present

## 2020-01-11 DIAGNOSIS — Z1211 Encounter for screening for malignant neoplasm of colon: Secondary | ICD-10-CM | POA: Diagnosis not present

## 2020-01-11 DIAGNOSIS — E559 Vitamin D deficiency, unspecified: Secondary | ICD-10-CM | POA: Diagnosis not present

## 2020-01-11 DIAGNOSIS — F329 Major depressive disorder, single episode, unspecified: Secondary | ICD-10-CM | POA: Diagnosis not present

## 2020-01-11 DIAGNOSIS — Z0000 Encounter for general adult medical examination without abnormal findings: Secondary | ICD-10-CM | POA: Diagnosis not present

## 2020-01-11 DIAGNOSIS — M4120 Other idiopathic scoliosis, site unspecified: Secondary | ICD-10-CM | POA: Diagnosis not present

## 2020-01-11 DIAGNOSIS — M810 Age-related osteoporosis without current pathological fracture: Secondary | ICD-10-CM | POA: Diagnosis not present

## 2020-01-26 DIAGNOSIS — E7800 Pure hypercholesterolemia, unspecified: Secondary | ICD-10-CM | POA: Diagnosis not present

## 2020-01-26 DIAGNOSIS — F319 Bipolar disorder, unspecified: Secondary | ICD-10-CM | POA: Diagnosis not present

## 2020-01-26 DIAGNOSIS — M810 Age-related osteoporosis without current pathological fracture: Secondary | ICD-10-CM | POA: Diagnosis not present

## 2020-01-26 DIAGNOSIS — F329 Major depressive disorder, single episode, unspecified: Secondary | ICD-10-CM | POA: Diagnosis not present

## 2020-01-26 DIAGNOSIS — E039 Hypothyroidism, unspecified: Secondary | ICD-10-CM | POA: Diagnosis not present

## 2020-03-16 DIAGNOSIS — F3181 Bipolar II disorder: Secondary | ICD-10-CM | POA: Diagnosis not present

## 2020-04-20 DIAGNOSIS — E039 Hypothyroidism, unspecified: Secondary | ICD-10-CM | POA: Diagnosis not present

## 2020-04-20 DIAGNOSIS — F329 Major depressive disorder, single episode, unspecified: Secondary | ICD-10-CM | POA: Diagnosis not present

## 2020-04-20 DIAGNOSIS — M810 Age-related osteoporosis without current pathological fracture: Secondary | ICD-10-CM | POA: Diagnosis not present

## 2020-04-20 DIAGNOSIS — E7800 Pure hypercholesterolemia, unspecified: Secondary | ICD-10-CM | POA: Diagnosis not present

## 2020-04-20 DIAGNOSIS — F319 Bipolar disorder, unspecified: Secondary | ICD-10-CM | POA: Diagnosis not present

## 2020-06-16 DIAGNOSIS — Z1211 Encounter for screening for malignant neoplasm of colon: Secondary | ICD-10-CM | POA: Diagnosis not present

## 2020-06-16 DIAGNOSIS — M810 Age-related osteoporosis without current pathological fracture: Secondary | ICD-10-CM | POA: Diagnosis not present

## 2020-06-16 DIAGNOSIS — E039 Hypothyroidism, unspecified: Secondary | ICD-10-CM | POA: Diagnosis not present

## 2020-06-16 DIAGNOSIS — E7800 Pure hypercholesterolemia, unspecified: Secondary | ICD-10-CM | POA: Diagnosis not present

## 2020-07-11 DIAGNOSIS — M9901 Segmental and somatic dysfunction of cervical region: Secondary | ICD-10-CM | POA: Diagnosis not present

## 2020-07-11 DIAGNOSIS — M5412 Radiculopathy, cervical region: Secondary | ICD-10-CM | POA: Diagnosis not present

## 2020-07-11 DIAGNOSIS — M5416 Radiculopathy, lumbar region: Secondary | ICD-10-CM | POA: Diagnosis not present

## 2020-07-11 DIAGNOSIS — M9903 Segmental and somatic dysfunction of lumbar region: Secondary | ICD-10-CM | POA: Diagnosis not present

## 2020-07-12 DIAGNOSIS — M5412 Radiculopathy, cervical region: Secondary | ICD-10-CM | POA: Diagnosis not present

## 2020-07-12 DIAGNOSIS — M9901 Segmental and somatic dysfunction of cervical region: Secondary | ICD-10-CM | POA: Diagnosis not present

## 2020-07-12 DIAGNOSIS — M5416 Radiculopathy, lumbar region: Secondary | ICD-10-CM | POA: Diagnosis not present

## 2020-07-12 DIAGNOSIS — M9903 Segmental and somatic dysfunction of lumbar region: Secondary | ICD-10-CM | POA: Diagnosis not present

## 2020-07-14 DIAGNOSIS — M5416 Radiculopathy, lumbar region: Secondary | ICD-10-CM | POA: Diagnosis not present

## 2020-07-14 DIAGNOSIS — M5412 Radiculopathy, cervical region: Secondary | ICD-10-CM | POA: Diagnosis not present

## 2020-07-14 DIAGNOSIS — M9903 Segmental and somatic dysfunction of lumbar region: Secondary | ICD-10-CM | POA: Diagnosis not present

## 2020-07-14 DIAGNOSIS — M9901 Segmental and somatic dysfunction of cervical region: Secondary | ICD-10-CM | POA: Diagnosis not present

## 2020-07-18 DIAGNOSIS — M5412 Radiculopathy, cervical region: Secondary | ICD-10-CM | POA: Diagnosis not present

## 2020-07-18 DIAGNOSIS — M9901 Segmental and somatic dysfunction of cervical region: Secondary | ICD-10-CM | POA: Diagnosis not present

## 2020-07-18 DIAGNOSIS — M5416 Radiculopathy, lumbar region: Secondary | ICD-10-CM | POA: Diagnosis not present

## 2020-07-18 DIAGNOSIS — M9903 Segmental and somatic dysfunction of lumbar region: Secondary | ICD-10-CM | POA: Diagnosis not present

## 2020-07-19 DIAGNOSIS — F329 Major depressive disorder, single episode, unspecified: Secondary | ICD-10-CM | POA: Diagnosis not present

## 2020-07-19 DIAGNOSIS — E039 Hypothyroidism, unspecified: Secondary | ICD-10-CM | POA: Diagnosis not present

## 2020-07-19 DIAGNOSIS — M5416 Radiculopathy, lumbar region: Secondary | ICD-10-CM | POA: Diagnosis not present

## 2020-07-19 DIAGNOSIS — M9903 Segmental and somatic dysfunction of lumbar region: Secondary | ICD-10-CM | POA: Diagnosis not present

## 2020-07-19 DIAGNOSIS — M810 Age-related osteoporosis without current pathological fracture: Secondary | ICD-10-CM | POA: Diagnosis not present

## 2020-07-19 DIAGNOSIS — M9901 Segmental and somatic dysfunction of cervical region: Secondary | ICD-10-CM | POA: Diagnosis not present

## 2020-07-19 DIAGNOSIS — F319 Bipolar disorder, unspecified: Secondary | ICD-10-CM | POA: Diagnosis not present

## 2020-07-19 DIAGNOSIS — M5412 Radiculopathy, cervical region: Secondary | ICD-10-CM | POA: Diagnosis not present

## 2020-07-19 DIAGNOSIS — E7800 Pure hypercholesterolemia, unspecified: Secondary | ICD-10-CM | POA: Diagnosis not present

## 2020-07-21 DIAGNOSIS — M5412 Radiculopathy, cervical region: Secondary | ICD-10-CM | POA: Diagnosis not present

## 2020-07-21 DIAGNOSIS — M9901 Segmental and somatic dysfunction of cervical region: Secondary | ICD-10-CM | POA: Diagnosis not present

## 2020-07-21 DIAGNOSIS — M5416 Radiculopathy, lumbar region: Secondary | ICD-10-CM | POA: Diagnosis not present

## 2020-07-21 DIAGNOSIS — M9903 Segmental and somatic dysfunction of lumbar region: Secondary | ICD-10-CM | POA: Diagnosis not present

## 2020-07-25 DIAGNOSIS — M5412 Radiculopathy, cervical region: Secondary | ICD-10-CM | POA: Diagnosis not present

## 2020-07-25 DIAGNOSIS — M5416 Radiculopathy, lumbar region: Secondary | ICD-10-CM | POA: Diagnosis not present

## 2020-07-25 DIAGNOSIS — M9903 Segmental and somatic dysfunction of lumbar region: Secondary | ICD-10-CM | POA: Diagnosis not present

## 2020-07-25 DIAGNOSIS — M9901 Segmental and somatic dysfunction of cervical region: Secondary | ICD-10-CM | POA: Diagnosis not present

## 2020-07-27 DIAGNOSIS — M9901 Segmental and somatic dysfunction of cervical region: Secondary | ICD-10-CM | POA: Diagnosis not present

## 2020-07-27 DIAGNOSIS — M5412 Radiculopathy, cervical region: Secondary | ICD-10-CM | POA: Diagnosis not present

## 2020-07-27 DIAGNOSIS — M5416 Radiculopathy, lumbar region: Secondary | ICD-10-CM | POA: Diagnosis not present

## 2020-07-27 DIAGNOSIS — M9903 Segmental and somatic dysfunction of lumbar region: Secondary | ICD-10-CM | POA: Diagnosis not present

## 2020-08-08 DIAGNOSIS — M5416 Radiculopathy, lumbar region: Secondary | ICD-10-CM | POA: Diagnosis not present

## 2020-08-08 DIAGNOSIS — M5412 Radiculopathy, cervical region: Secondary | ICD-10-CM | POA: Diagnosis not present

## 2020-08-08 DIAGNOSIS — M9903 Segmental and somatic dysfunction of lumbar region: Secondary | ICD-10-CM | POA: Diagnosis not present

## 2020-08-08 DIAGNOSIS — M9901 Segmental and somatic dysfunction of cervical region: Secondary | ICD-10-CM | POA: Diagnosis not present

## 2020-08-10 DIAGNOSIS — M5412 Radiculopathy, cervical region: Secondary | ICD-10-CM | POA: Diagnosis not present

## 2020-08-10 DIAGNOSIS — M9901 Segmental and somatic dysfunction of cervical region: Secondary | ICD-10-CM | POA: Diagnosis not present

## 2020-08-10 DIAGNOSIS — M9903 Segmental and somatic dysfunction of lumbar region: Secondary | ICD-10-CM | POA: Diagnosis not present

## 2020-08-10 DIAGNOSIS — M5416 Radiculopathy, lumbar region: Secondary | ICD-10-CM | POA: Diagnosis not present

## 2020-08-17 DIAGNOSIS — F331 Major depressive disorder, recurrent, moderate: Secondary | ICD-10-CM | POA: Diagnosis not present

## 2020-08-19 DIAGNOSIS — F329 Major depressive disorder, single episode, unspecified: Secondary | ICD-10-CM | POA: Diagnosis not present

## 2020-08-19 DIAGNOSIS — F319 Bipolar disorder, unspecified: Secondary | ICD-10-CM | POA: Diagnosis not present

## 2020-08-19 DIAGNOSIS — M810 Age-related osteoporosis without current pathological fracture: Secondary | ICD-10-CM | POA: Diagnosis not present

## 2020-08-19 DIAGNOSIS — E039 Hypothyroidism, unspecified: Secondary | ICD-10-CM | POA: Diagnosis not present

## 2020-08-19 DIAGNOSIS — E7800 Pure hypercholesterolemia, unspecified: Secondary | ICD-10-CM | POA: Diagnosis not present

## 2020-08-25 DIAGNOSIS — M5416 Radiculopathy, lumbar region: Secondary | ICD-10-CM | POA: Diagnosis not present

## 2020-08-25 DIAGNOSIS — M5412 Radiculopathy, cervical region: Secondary | ICD-10-CM | POA: Diagnosis not present

## 2020-08-25 DIAGNOSIS — M9901 Segmental and somatic dysfunction of cervical region: Secondary | ICD-10-CM | POA: Diagnosis not present

## 2020-08-25 DIAGNOSIS — M9903 Segmental and somatic dysfunction of lumbar region: Secondary | ICD-10-CM | POA: Diagnosis not present

## 2020-08-29 DIAGNOSIS — M9903 Segmental and somatic dysfunction of lumbar region: Secondary | ICD-10-CM | POA: Diagnosis not present

## 2020-08-29 DIAGNOSIS — M9901 Segmental and somatic dysfunction of cervical region: Secondary | ICD-10-CM | POA: Diagnosis not present

## 2020-08-29 DIAGNOSIS — M5412 Radiculopathy, cervical region: Secondary | ICD-10-CM | POA: Diagnosis not present

## 2020-08-29 DIAGNOSIS — M5416 Radiculopathy, lumbar region: Secondary | ICD-10-CM | POA: Diagnosis not present

## 2020-08-31 DIAGNOSIS — M9903 Segmental and somatic dysfunction of lumbar region: Secondary | ICD-10-CM | POA: Diagnosis not present

## 2020-08-31 DIAGNOSIS — M9901 Segmental and somatic dysfunction of cervical region: Secondary | ICD-10-CM | POA: Diagnosis not present

## 2020-08-31 DIAGNOSIS — M5412 Radiculopathy, cervical region: Secondary | ICD-10-CM | POA: Diagnosis not present

## 2020-08-31 DIAGNOSIS — M5416 Radiculopathy, lumbar region: Secondary | ICD-10-CM | POA: Diagnosis not present

## 2020-09-02 DIAGNOSIS — M5412 Radiculopathy, cervical region: Secondary | ICD-10-CM | POA: Diagnosis not present

## 2020-09-02 DIAGNOSIS — M9901 Segmental and somatic dysfunction of cervical region: Secondary | ICD-10-CM | POA: Diagnosis not present

## 2020-09-02 DIAGNOSIS — M5416 Radiculopathy, lumbar region: Secondary | ICD-10-CM | POA: Diagnosis not present

## 2020-09-02 DIAGNOSIS — M9903 Segmental and somatic dysfunction of lumbar region: Secondary | ICD-10-CM | POA: Diagnosis not present

## 2020-10-17 DIAGNOSIS — F319 Bipolar disorder, unspecified: Secondary | ICD-10-CM | POA: Diagnosis not present

## 2020-10-17 DIAGNOSIS — E7800 Pure hypercholesterolemia, unspecified: Secondary | ICD-10-CM | POA: Diagnosis not present

## 2020-10-17 DIAGNOSIS — E039 Hypothyroidism, unspecified: Secondary | ICD-10-CM | POA: Diagnosis not present

## 2020-10-17 DIAGNOSIS — F329 Major depressive disorder, single episode, unspecified: Secondary | ICD-10-CM | POA: Diagnosis not present

## 2020-10-17 DIAGNOSIS — M810 Age-related osteoporosis without current pathological fracture: Secondary | ICD-10-CM | POA: Diagnosis not present

## 2020-10-26 DIAGNOSIS — M25552 Pain in left hip: Secondary | ICD-10-CM | POA: Diagnosis not present

## 2020-10-26 DIAGNOSIS — M25511 Pain in right shoulder: Secondary | ICD-10-CM | POA: Diagnosis not present

## 2020-10-26 DIAGNOSIS — R296 Repeated falls: Secondary | ICD-10-CM | POA: Diagnosis not present

## 2020-10-27 ENCOUNTER — Other Ambulatory Visit: Payer: Self-pay | Admitting: Family Medicine

## 2020-10-27 ENCOUNTER — Ambulatory Visit
Admission: RE | Admit: 2020-10-27 | Discharge: 2020-10-27 | Disposition: A | Discharge status: Home / Self Care | Payer: PPO | Source: Ambulatory Visit | Attending: Family Medicine | Admitting: Family Medicine

## 2020-10-27 DIAGNOSIS — M25552 Pain in left hip: Secondary | ICD-10-CM | POA: Diagnosis not present

## 2020-10-27 DIAGNOSIS — M25511 Pain in right shoulder: Secondary | ICD-10-CM | POA: Diagnosis not present

## 2020-12-26 DIAGNOSIS — M25511 Pain in right shoulder: Secondary | ICD-10-CM | POA: Diagnosis not present

## 2020-12-26 DIAGNOSIS — M25552 Pain in left hip: Secondary | ICD-10-CM | POA: Diagnosis not present

## 2020-12-28 ENCOUNTER — Encounter: Payer: Self-pay | Admitting: Neurology

## 2021-01-11 DIAGNOSIS — E559 Vitamin D deficiency, unspecified: Secondary | ICD-10-CM | POA: Diagnosis not present

## 2021-01-11 DIAGNOSIS — E7800 Pure hypercholesterolemia, unspecified: Secondary | ICD-10-CM | POA: Diagnosis not present

## 2021-01-11 DIAGNOSIS — E039 Hypothyroidism, unspecified: Secondary | ICD-10-CM | POA: Diagnosis not present

## 2021-01-11 DIAGNOSIS — Z0000 Encounter for general adult medical examination without abnormal findings: Secondary | ICD-10-CM | POA: Diagnosis not present

## 2021-02-08 DIAGNOSIS — F3181 Bipolar II disorder: Secondary | ICD-10-CM | POA: Diagnosis not present

## 2021-02-08 DIAGNOSIS — F902 Attention-deficit hyperactivity disorder, combined type: Secondary | ICD-10-CM | POA: Diagnosis not present

## 2021-03-23 NOTE — Progress Notes (Signed)
NEUROLOGY CONSULTATION NOTE  [name] MRN: [id number] DOB: 1945/03/21  Referring provider: Marcene Corning, MD Primary care provider: Juluis Rainier, MD  Reason for consult:  falls  Assessment/Plan:   Gait instability with frequent falls.  May still be skeletal (related to her scoliosis and leg length discrepancy).  However, with such a significant decline, I would evaluate for a primary neurologic etiology.  Consider cerebrovascular disease vs myelopathy such as due to spinal stenosis.  She does not exhibit any signs of polyneuropathy, underlying neurodegenerative disease and unlikely to be due to lumbar spinal stenosis.  Lower extremity strength intact.   Since she does exhibit hyperreflexia, will evaluate for cervical myelopathy due to spinal stenosis.  If that is unremarkable, then would check MRI of brain.   Subjective:  [name] is a 76 year old right-handed female with hypothyroidism, HLD, arthritis, anxiety/depression and Bipolar disorder who presents for falls.  History supplemented by her accompanying husband and referring provider's note.  She has leg length discrepancy related to both scoliosis and remote femoral neck fracture status post pinning, for which she wears a lift in her left shoe.  Due to this, she has always felt off balance when she would turn to the left.  However, she has had significant decline in gait stability over the past year.  When she walks, she will fall either forward or back.  She has been averaging one fall a month.  No associated dizziness, leg weakness, or numbness in the feet.  No lumbosacral radicular pain.  Denies double vision.  She used to walk the dog three times a day and now she has to use a cane.  Since she has become more sedentary, she has become weaker.  If she falls, she now has a difficult time getting up.  X-ray of the left hip on 10/27/2020 personally reviewed showed subtle lucency over the left femoral neck likely  related to previous fracture/surgery.       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 prolapse    Scoliosis    VAIN I (vaginal intraepithelial neoplasia grade I)     PAST SURGICAL HISTORY: Past Surgical History:  Procedure Laterality Date   ABDOMINAL HYSTERECTOMY  1992   AND APPENDECTOMY   ANTERIOR AND POSTERIOR VAGINAL REPAIR     With sling   CLOSED REDUCTION AND PINNING LEFT HIP FEMORAL NECK FX  01-15-2000   REMOVAL HALF OF HARDWARE IN 2004   COLPOSCOPY     HEMORRHOID SURGERY  09-09-2007   LASER ABLATION OF THE CERVIX     PUBOVAGINAL SLING  09/17/2011   Procedure: Leonides Grills;  Surgeon: Kathi Ludwig, MD;  Location: [facility];  Service: Urology;  Laterality: N/A;  UPHOLD SACRAL SPINUS FIXATION OWER TO MAIN [employer] REP TO BE PRESENT, PAM WILL COORDINATE   TUBAL LIGATION  1981    MEDICATIONS: Current Outpatient Medications on File Prior to Visit  Medication Sig Dispense Refill   ALPRAZolam (XANAX) 0.5 MG tablet Take 0.5 mg by mouth every morning. 3 times a day PRN     amphetamine-dextroamphetamine (ADDERALL) 10 MG tablet Take 10 mg by  mouth 2 (two) times daily.     atorvastatin (LIPITOR) 20 MG tablet Take 20 mg by mouth daily.     Cholecalciferol (VITAMIN D3) 10000 UNITS capsule Take 10,000 Units by mouth daily.     citalopram (CELEXA) 40 MG tablet Take by mouth.     conjugated estrogens (PREMARIN) vaginal cream Place 1 gram vaginally at bedtime 2 times per week. 42.5 g 1   cyclobenzaprine (FLEXERIL) 10 MG tablet Take 10 mg by mouth 3 (three) times daily as needed.     fluticasone (FLONASE) 50 MCG/ACT nasal spray Place 2 sprays into the nose every  morning.     Glucosamine-Chondroitin 1500-1200 MG/30ML LIQD Take by mouth daily.     levothyroxine (SYNTHROID, LEVOTHROID) 88 MCG tablet Take 88 mcg by mouth every morning.     meloxicam (MOBIC) 15 MG tablet TAKE 1 TABLET ONCE A DAY ORALLY     simvastatin (ZOCOR) 20 MG tablet Take 20 mg by mouth every morning.     tiZANidine (ZANAFLEX) 2 MG tablet Take 2 mg by mouth 3 (three) times daily as needed.     traZODone (DESYREL) 50 MG tablet Take 50 mg by mouth as needed.      No current facility-administered medications on file prior to visit.    ALLERGIES: Allergies  Allergen Reactions   Darvocet [Propoxyphene N-Acetaminophen] Hives   Other Hives   Tetanus Antitoxin Swelling   Lidocaine-Epinephrine Palpitations    FAMILY HISTORY: Family History  Problem Relation Age of Onset   Breast cancer Mother    Cancer Sister        1/2 sister Lymphoma    Objective:  Blood pressure 121/75, pulse 81, resp. rate 18, height 5\' 2"  (1.575 m), weight 118 lb (53.5 kg), SpO2 96 %. General: No acute distress.  Patient appears well-groomed.   Head:  Normocephalic/atraumatic Eyes:  fundi examined but not visualized Neck: supple, no paraspinal tenderness, full range of motion Back: No paraspinal tenderness Heart: regular rate and rhythm Lungs: Clear to auscultation bilaterally. Vascular: No carotid bruits. Neurological Exam: Mental status: alert and oriented to person, place, and time, speech fluent and not dysarthric, language intact. Cranial nerves: CN I: not tested CN II: pupils equal, round and reactive to light, visual fields intact CN III, IV, VI:  full range of motion, no nystagmus, no ptosis CN V: facial sensation intact. CN VII: upper and lower face symmetric CN VIII: hearing intact CN IX, X: gag intact, uvula midline CN XI: sternocleidomastoid and trapezius muscles intact CN XII: tongue midline Bulk & Tone: normal, no fasciculations. Motor:  muscle strength 5/5 throughout Sensation:   Pinprick, temperature and vibratory sensation intact. Deep Tendon Reflexes:  3+ throughout,  Hoffman sign absent, Babinski sign absent   Finger to nose testing:  Without dysmetria.   Heel to shin:  Without dysmetria.   Gait:  Asymmetrical gait  Romberg with mild sway.    Thank you for allowing me to take part in the care of this patient.  , DO  CC:  Shon Millet, MD  Juluis Rainier, MD

## 2021-03-24 ENCOUNTER — Encounter: Payer: Self-pay | Admitting: Neurology

## 2021-03-24 ENCOUNTER — Ambulatory Visit: Payer: PPO | Admitting: Neurology

## 2021-03-24 ENCOUNTER — Other Ambulatory Visit: Payer: Self-pay

## 2021-03-24 VITALS — BP 121/75 | HR 81 | Resp 18 | Ht 62.0 in | Wt 118.0 lb

## 2021-03-24 DIAGNOSIS — R296 Repeated falls: Secondary | ICD-10-CM | POA: Diagnosis not present

## 2021-03-24 DIAGNOSIS — R2681 Unsteadiness on feet: Secondary | ICD-10-CM

## 2021-03-24 DIAGNOSIS — R292 Abnormal reflex: Secondary | ICD-10-CM

## 2021-03-24 NOTE — Patient Instructions (Addendum)
Will check MRI of cervical spine.  If unremarkable, will then check MRI of brain.  Further recommendations pending results.  We have sent a referral to [facility] for your MRI and they will call you directly to schedule your appointment. They are located at [address] [facility]. If you need to contact them directly please call [phone].

## 2021-04-11 ENCOUNTER — Ambulatory Visit
Admission: RE | Admit: 2021-04-11 | Discharge: 2021-04-11 | Disposition: A | Discharge status: Home / Self Care | Payer: PPO | Source: Ambulatory Visit | Attending: Neurology | Admitting: Neurology

## 2021-04-11 DIAGNOSIS — M2578 Osteophyte, vertebrae: Secondary | ICD-10-CM | POA: Diagnosis not present

## 2021-04-11 DIAGNOSIS — M4312 Spondylolisthesis, cervical region: Secondary | ICD-10-CM | POA: Diagnosis not present

## 2021-04-11 DIAGNOSIS — M4802 Spinal stenosis, cervical region: Secondary | ICD-10-CM | POA: Diagnosis not present

## 2021-04-11 IMAGING — MR MR CERVICAL SPINE W/O CM
5 series · 39 of 48 positions shown · non-contrast
Comparison: None.

CLINICAL DATA: Knee instability.  Right arm pain

EXAM:
MRI CERVICAL SPINE WITHOUT CONTRAST
TECHNIQUE: Multiplanar, multisequence MR imaging of the cervical spine was
performed. No intravenous contrast was administered.

[Series 5: T2 · sagittal · 3.0mm · 0.66mm/px · 6 of 19 slices shown (1 of 2)]
[im 1/19]
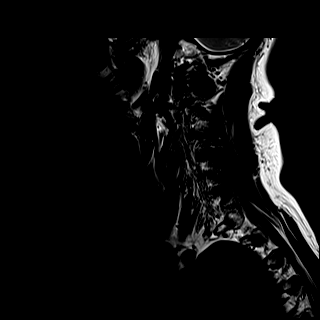
[im 4/19]
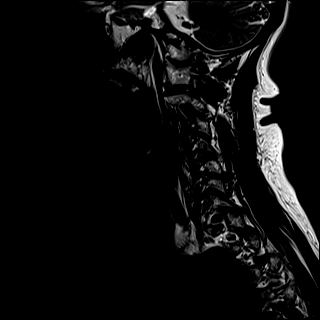
[im 8/19]
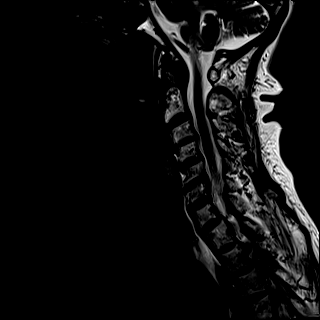
[im 11/19]
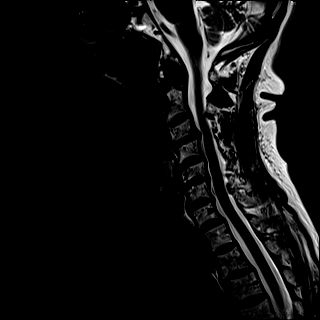
[im 15/19]
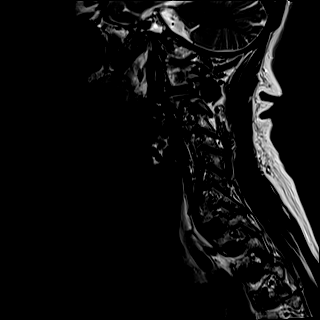
[im 19/19]
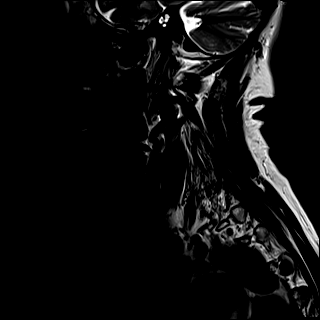

[Series 6: T1 · sagittal · 3.0mm · 0.82mm/px · 6 of 19 slices shown]
[im 1/19]
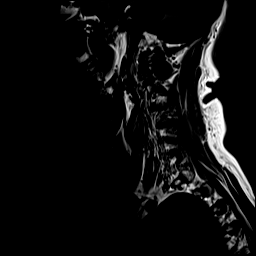
[im 4/19]
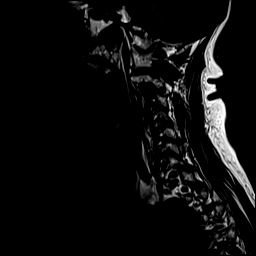
[im 8/19]
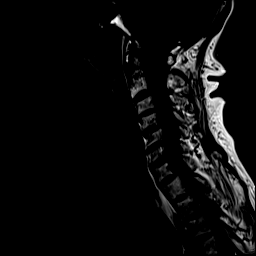
[im 11/19]
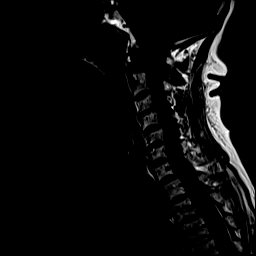
[im 15/19]
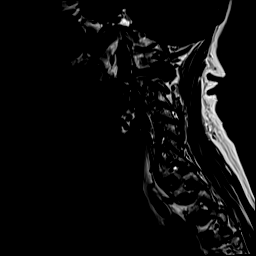
[im 19/19]
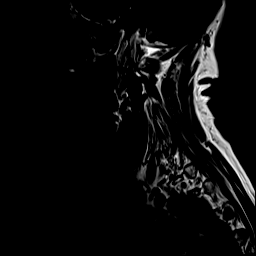

[Series 7: STIR · sagittal · 3.0mm · 0.41mm/px · 6 of 19 slices shown]
[im 1/19]
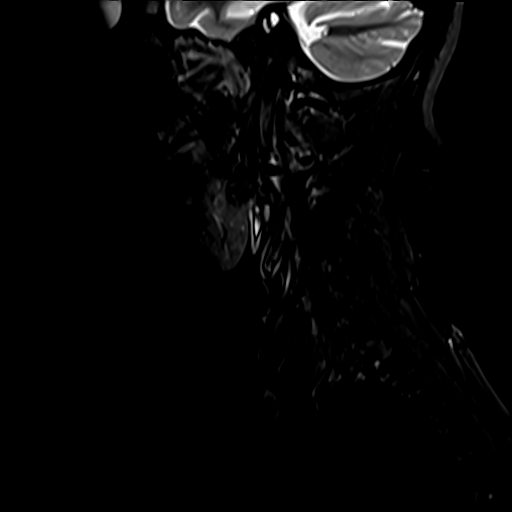
[im 4/19]
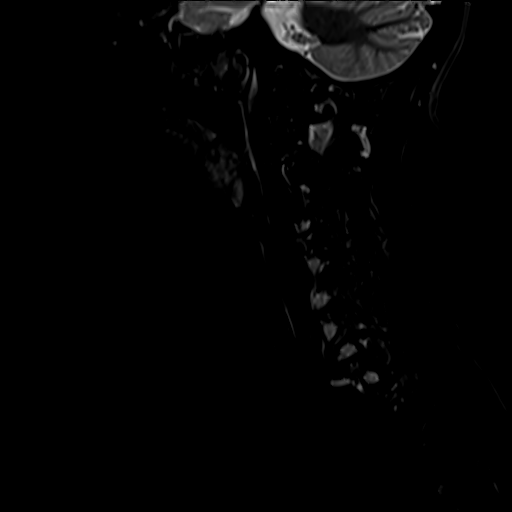
[im 8/19]
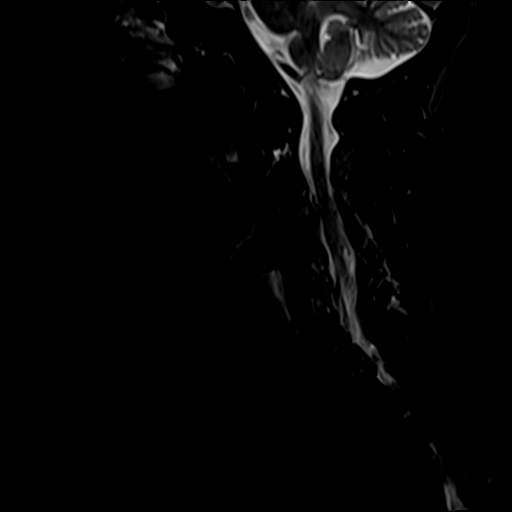
[im 11/19]
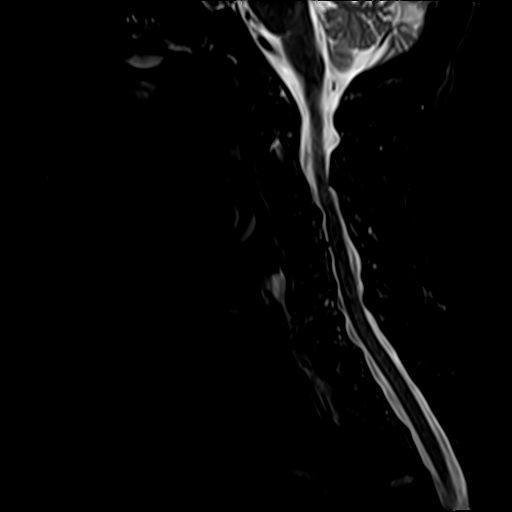
[im 15/19]
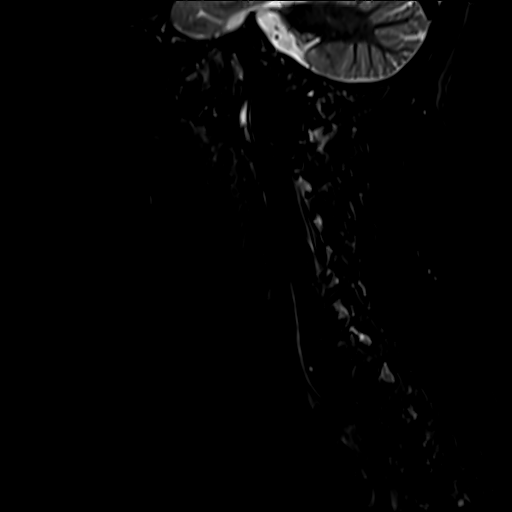
[im 19/19]
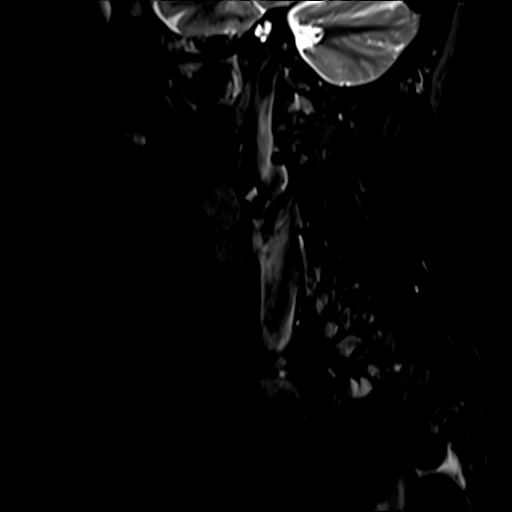

[Series 8: T2 · axial · 3.0mm · 0.62mm/px · z∈[-91,+58]mm · 13 of 50 slices shown (2 of 2)]
[im 1/50]
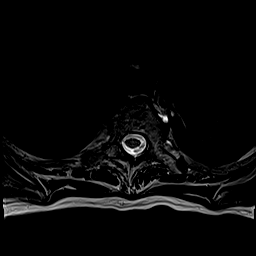
[im 4/50]
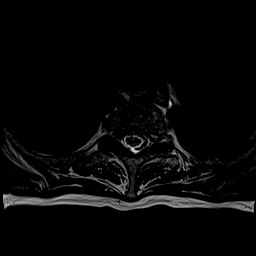
[im 8/50]
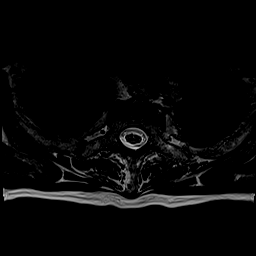
[im 11/50]
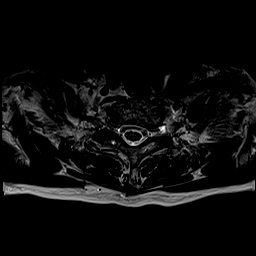
[im 15/50]
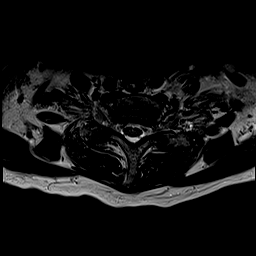
[im 18/50]
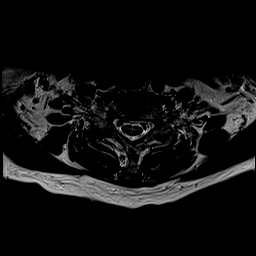
[im 22/50]
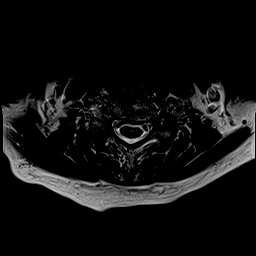
[im 25/50]
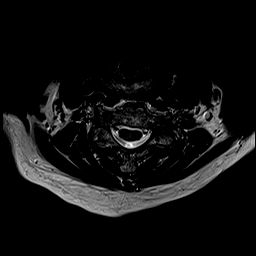
[im 29/50]
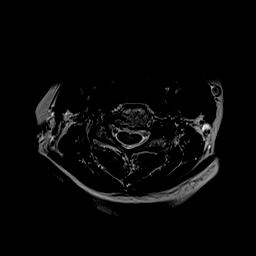
[im 32/50]
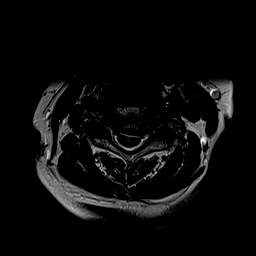
[im 36/50]
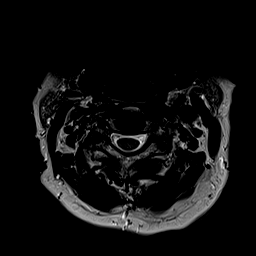
[im 43/50]
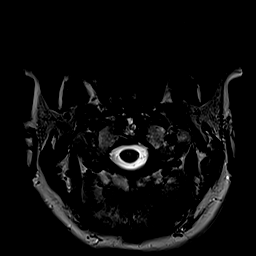
[im 50/50]
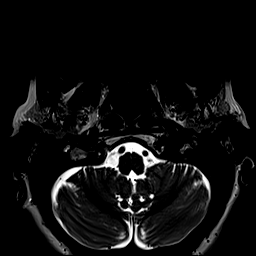

[Series 9: GRE · axial · 3.0mm · 0.42mm/px · z∈[-93,+56]mm · 8 of 50 slices shown]
[im 1/50]
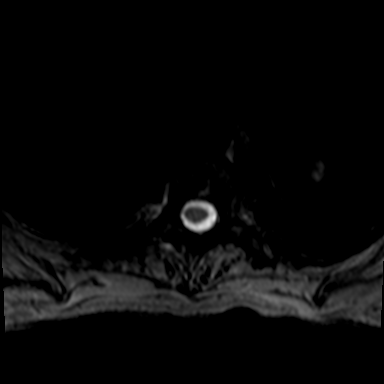
[im 8/50]
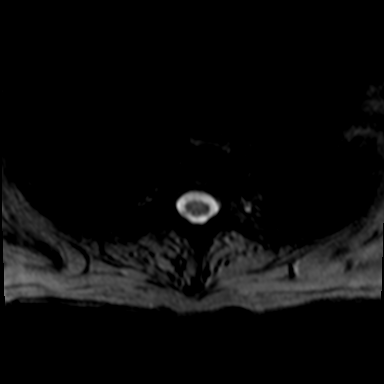
[im 15/50]
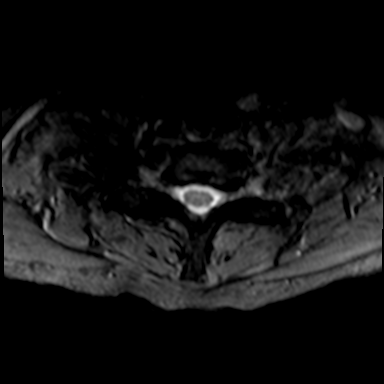
[im 22/50]
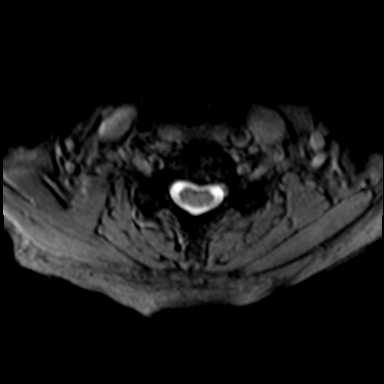
[im 29/50]
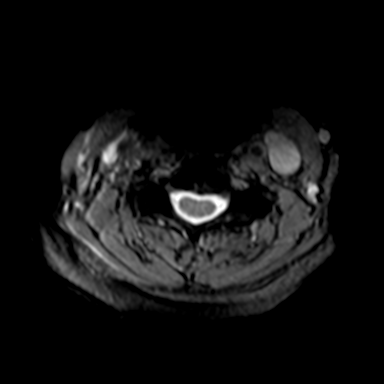
[im 36/50]
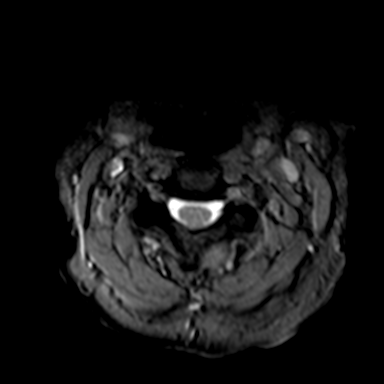
[im 43/50]
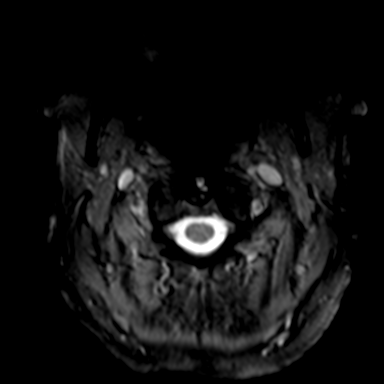
[im 50/50]
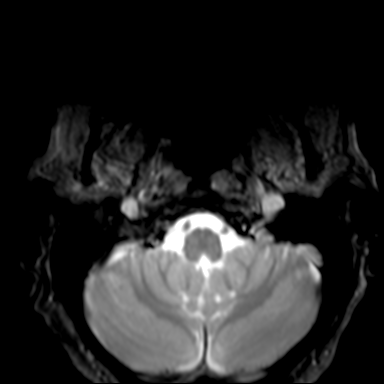

[39 of 48 positions shown; findings below may reference images not displayed]

FINDINGS: Alignment: Grade 1 anterolisthesis of C2 on C3, C4 on C5, and C7 on
T1.

Vertebrae: No fracture, evidence of discitis, or bone lesion.
Degenerative anterior endplate spurring at C5-6. Degenerative
subcortical cystic changes within the dens.

Cord: Normal signal and morphology.

Posterior Fossa, vertebral arteries, paraspinal tissues: Negative.

Disc levels:

C2-C3: Disc uncovering without focal disc protrusion. Bilateral
facet arthropathy. No foraminal or canal stenosis.

C3-C4: Disc osteophyte complex, eccentric to the left. Bilateral
facet and uncovertebral arthropathy. Mild canal stenosis with mild
bilateral foraminal stenosis.

C4-C5: Disc uncovering and mild endplate ridging. Bilateral facet
arthropathy and mild uncovertebral spurring. Mild bilateral
foraminal stenosis. No canal stenosis.

C5-C6: Mild disc osteophyte complex with bilateral facet and
uncovertebral arthropathy. Moderate left and mild right foraminal
stenosis. No canal stenosis.

C6-C7: Minimal disc osteophyte complex with mild facet arthropathy.
No foraminal or canal stenosis.

C7-T1: Disc uncovering and mild endplate ridging. Bilateral facet
arthropathy. No foraminal or canal stenosis.
IMPRESSION: 1. Multilevel degenerative changes of the cervical spine, as
described above. Mild canal stenosis at C3-4.
2. Moderate left and mild right foraminal stenosis at C5-6. Mild
bilateral foraminal stenosis at C3-4 and C4-5.

## 2021-04-26 DIAGNOSIS — M67911 Unspecified disorder of synovium and tendon, right shoulder: Secondary | ICD-10-CM | POA: Diagnosis not present

## 2021-04-26 DIAGNOSIS — M25551 Pain in right hip: Secondary | ICD-10-CM | POA: Diagnosis not present

## 2021-04-26 DIAGNOSIS — M25552 Pain in left hip: Secondary | ICD-10-CM | POA: Diagnosis not present

## 2021-05-02 ENCOUNTER — Other Ambulatory Visit: Payer: Self-pay

## 2021-05-02 DIAGNOSIS — R296 Repeated falls: Secondary | ICD-10-CM

## 2021-05-02 DIAGNOSIS — R292 Abnormal reflex: Secondary | ICD-10-CM

## 2021-05-02 DIAGNOSIS — R2681 Unsteadiness on feet: Secondary | ICD-10-CM

## 2021-05-10 ENCOUNTER — Other Ambulatory Visit: Payer: Self-pay | Admitting: Neurology

## 2021-05-10 DIAGNOSIS — R296 Repeated falls: Secondary | ICD-10-CM

## 2021-05-10 DIAGNOSIS — R2681 Unsteadiness on feet: Secondary | ICD-10-CM

## 2021-05-10 DIAGNOSIS — R292 Abnormal reflex: Secondary | ICD-10-CM

## 2021-05-14 ENCOUNTER — Ambulatory Visit
Admission: RE | Admit: 2021-05-14 | Discharge: 2021-05-14 | Disposition: A | Discharge status: Home / Self Care | Payer: PPO | Source: Ambulatory Visit | Attending: Neurology | Admitting: Neurology

## 2021-05-14 DIAGNOSIS — R296 Repeated falls: Secondary | ICD-10-CM | POA: Diagnosis not present

## 2021-05-14 DIAGNOSIS — R292 Abnormal reflex: Secondary | ICD-10-CM

## 2021-05-14 DIAGNOSIS — R2681 Unsteadiness on feet: Secondary | ICD-10-CM

## 2021-05-14 DIAGNOSIS — I6782 Cerebral ischemia: Secondary | ICD-10-CM | POA: Diagnosis not present

## 2021-05-14 DIAGNOSIS — I159 Secondary hypertension, unspecified: Secondary | ICD-10-CM | POA: Diagnosis not present

## 2021-05-14 IMAGING — MR MR HEAD W/O CM
11 series · 48 of 48 positions shown · non-contrast
Comparison: None.

CLINICAL DATA: Multiple falls

EXAM:
MRI HEAD WITHOUT CONTRAST
TECHNIQUE: Multiplanar, multiecho pulse sequences of the brain and surrounding
structures were obtained without intravenous contrast.

[Series 2: T1 · sagittal · 5.0mm · 0.45mm/px · 1 of 22 slices shown]
[im 1/22]
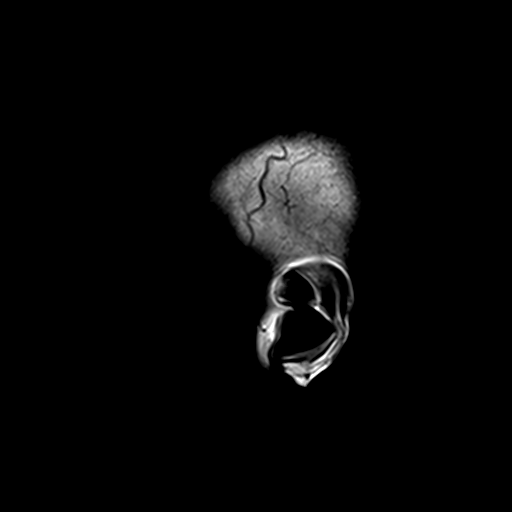

[Series 3: DWI · axial · 3.0mm · 1.80mm/px · z∈[-113,+33]mm · 8 of 100 slices shown (1 of 4)]
[im 1/100]
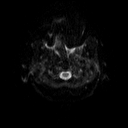
[im 15/100]
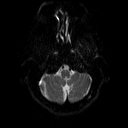
[im 29/100]
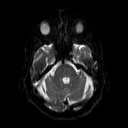
[im 43/100]
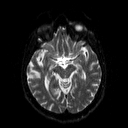
[im 57/100]
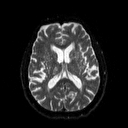
[im 71/100]
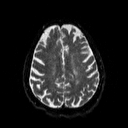
[im 85/100]
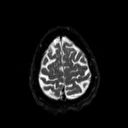
[im 100/100]
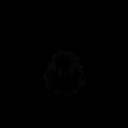

[Series 4: DWI · axial · 3.0mm · 1.80mm/px · z∈[-113,+33]mm · 4 of 50 slices shown (2 of 4)]
[im 1/50]
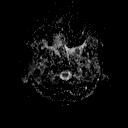
[im 17/50]
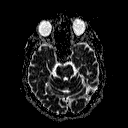
[im 33/50]
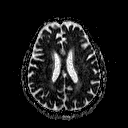
[im 50/50]
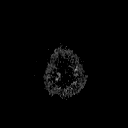

[Series 5: DWI · coronal · 5.0mm · 1.80mm/px · 5 of 69 slices shown (3 of 4)]
[im 1/69]
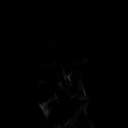
[im 18/69]
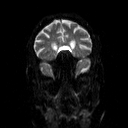
[im 35/69]
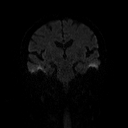
[im 52/69]
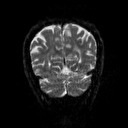
[im 69/69]
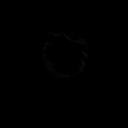

[Series 6: DWI · coronal · 5.0mm · 1.80mm/px · 3 of 35 slices shown (4 of 4)]
[im 1/35]
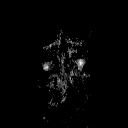
[im 18/35]
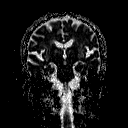
[im 35/35]
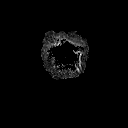

[Series 7: T2 · axial · 5.0mm · 0.51mm/px · z∈[-131,+36]mm · 2 of 25 slices shown (1 of 2)]
[im 1/25]
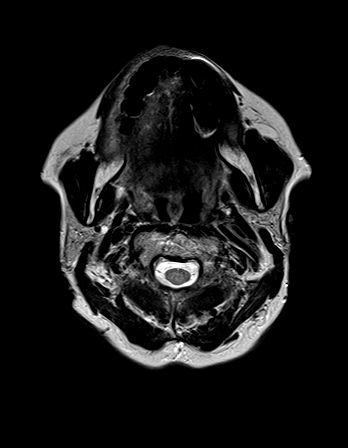
[im 25/25]
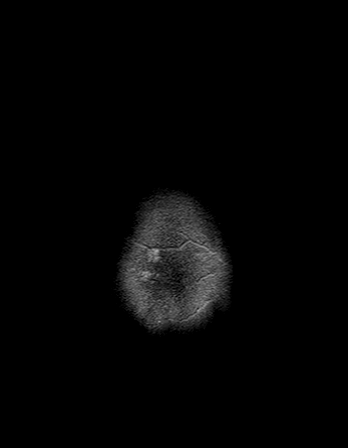

[Series 8: FLAIR · axial · 3.0mm · 0.45mm/px · z∈[-134,+37]mm · 3 of 38 slices shown]
[im 1/38]
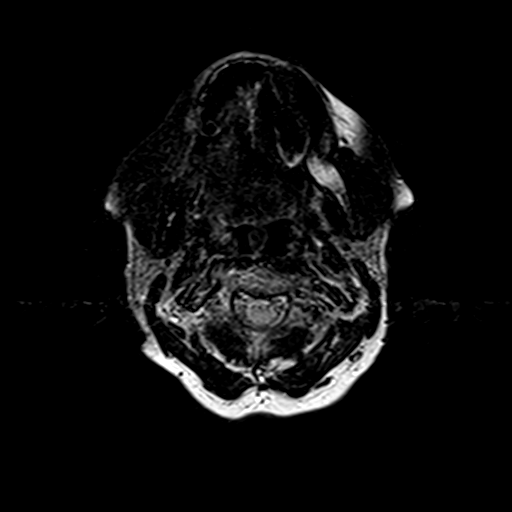
[im 19/38]
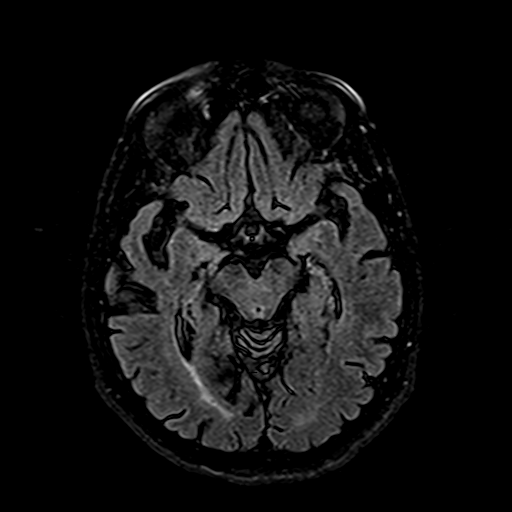
[im 38/38]
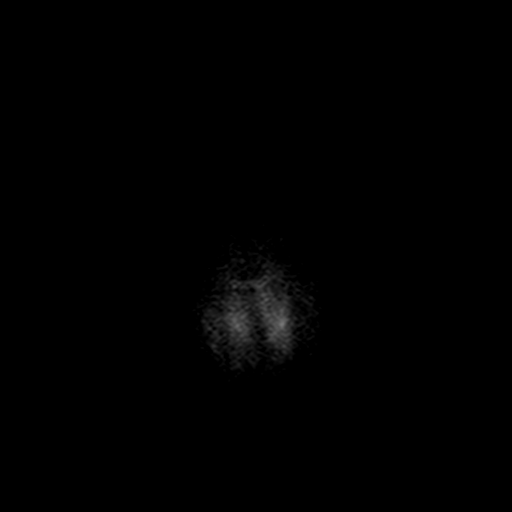

[Series 9: mip_images(sw) · axial · 32.0mm · 0.90mm/px · z∈[-119,+23]mm · 3 of 37 slices shown]
[im 1/37]
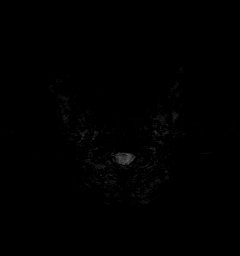
[im 19/37]
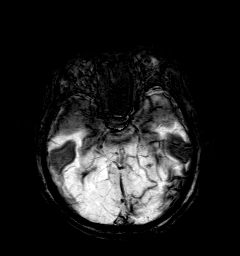
[im 37/37]
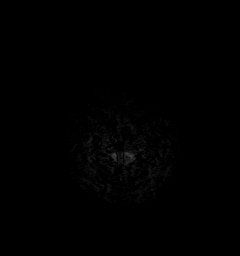

[Series 10: swi_images · axial · 4.0mm · 0.90mm/px · z∈[-133,+37]mm · 3 of 44 slices shown]
[im 1/44]
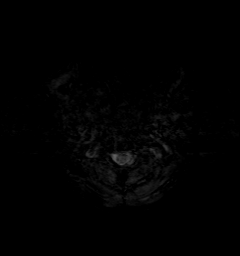
[im 22/44]
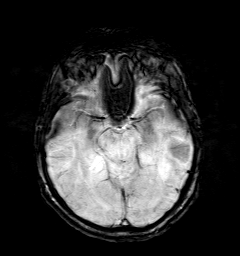
[im 44/44]
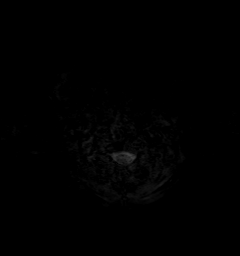

[Series 11: t1_mpr_tra · axial · 1.0mm · 0.71mm/px · z∈[-134,+39]mm · 14 of 176 slices shown]
[im 1/176]
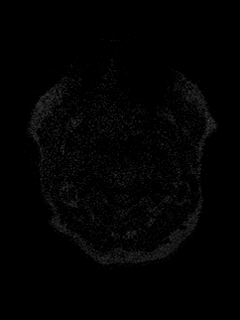
[im 14/176]
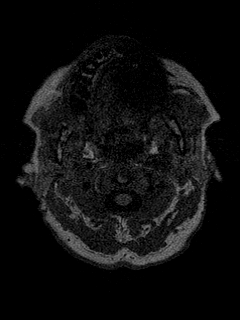
[im 27/176]
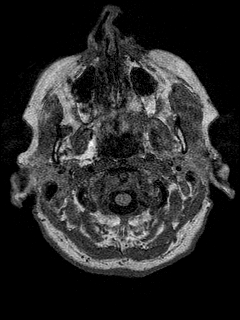
[im 41/176]
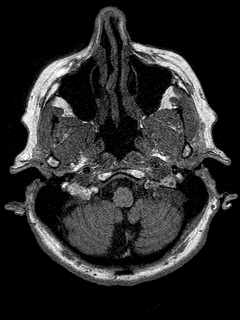
[im 54/176]
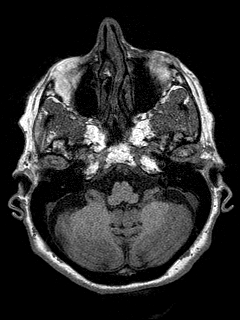
[im 68/176]
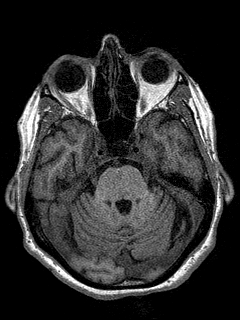
[im 81/176]
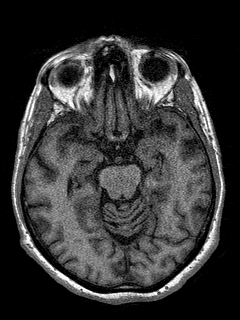
[im 95/176]
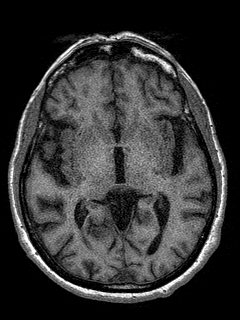
[im 108/176]
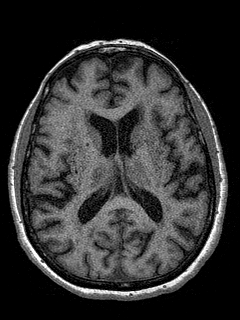
[im 122/176]
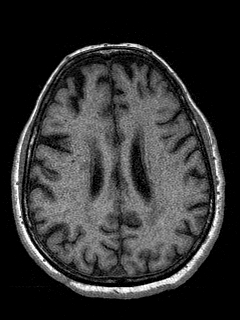
[im 135/176]
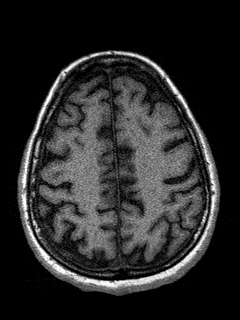
[im 149/176]
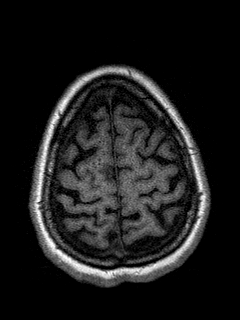
[im 162/176]
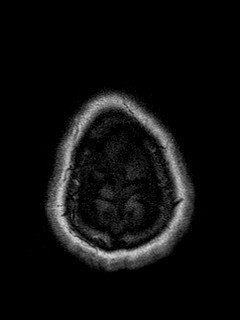
[im 176/176]
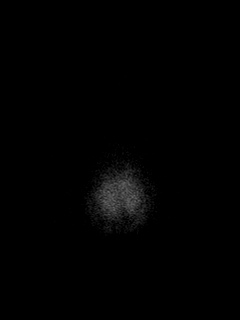

[Series 12: T2 · coronal · 5.0mm · 0.45mm/px · 2 of 26 slices shown (2 of 2)]
[im 1/26]
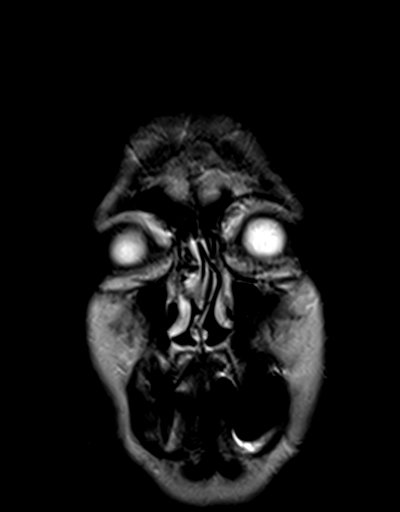
[im 26/26]
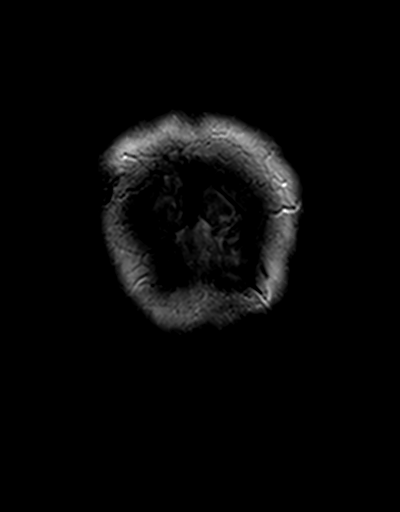

[48 of 48 positions shown; findings below may reference images not displayed]

FINDINGS: Brain: There is no acute infarction or intracranial hemorrhage.
There is no intracranial mass, mass effect, or edema. There is no
hydrocephalus or extra-axial fluid collection. Prominence of the
ventricles and sulci reflects generalized parenchymal volume loss.
Patchy and confluent areas of T2 hyperintensity in the
supratentorial white matter are nonspecific but may reflect mild to
moderate chronic microvascular ischemic changes. Bilateral thalamic
foci of susceptibility likely reflect hypertensive chronic
microhemorrhages.

Vascular: Major vessel flow voids at the skull base are preserved.

Skull and upper cervical spine: Normal marrow signal is preserved.

Sinuses/Orbits: Paranasal sinuses are aerated. Bilateral lens
replacements.

Other: Sella is unremarkable.  Mastoid air cells are clear.
IMPRESSION: No evidence of recent infarction, hemorrhage, or mass.

Mild to moderate chronic microvascular ischemic changes.

Mild burden of thalamic chronic microhemorrhages secondary to
hypertension.

## 2021-05-23 NOTE — Progress Notes (Signed)
LMOVM for patient to call the office back.

## 2021-05-29 ENCOUNTER — Telehealth: Payer: Self-pay

## 2021-05-29 DIAGNOSIS — R296 Repeated falls: Secondary | ICD-10-CM

## 2021-05-29 DIAGNOSIS — R292 Abnormal reflex: Secondary | ICD-10-CM

## 2021-05-29 DIAGNOSIS — R2681 Unsteadiness on feet: Secondary | ICD-10-CM

## 2021-05-29 NOTE — Telephone Encounter (Signed)
-----   Message from Drema Dallas, DO sent at 05/15/2021  4:08 PM EDT ----- ?MRI of brain shows chronic age related changes but nothing significant enough that would cause frequent falls.  Ultimately the balance problems may still be related to scoliosis and leg length discrepancy but I would also like to check a B12 level as well.  Low B12 may contribute to balance problems. ?

## 2021-06-06 ENCOUNTER — Ambulatory Visit: Payer: PPO | Admitting: Neurology

## 2021-06-30 DIAGNOSIS — R2681 Unsteadiness on feet: Secondary | ICD-10-CM | POA: Diagnosis not present

## 2021-06-30 DIAGNOSIS — M545 Low back pain, unspecified: Secondary | ICD-10-CM | POA: Diagnosis not present

## 2021-06-30 DIAGNOSIS — M25552 Pain in left hip: Secondary | ICD-10-CM | POA: Diagnosis not present

## 2021-06-30 DIAGNOSIS — M4120 Other idiopathic scoliosis, site unspecified: Secondary | ICD-10-CM | POA: Diagnosis not present

## 2021-07-05 DIAGNOSIS — M25552 Pain in left hip: Secondary | ICD-10-CM | POA: Diagnosis not present

## 2021-07-06 DIAGNOSIS — E7800 Pure hypercholesterolemia, unspecified: Secondary | ICD-10-CM | POA: Diagnosis not present

## 2021-07-06 DIAGNOSIS — E039 Hypothyroidism, unspecified: Secondary | ICD-10-CM | POA: Diagnosis not present

## 2021-07-06 DIAGNOSIS — M810 Age-related osteoporosis without current pathological fracture: Secondary | ICD-10-CM | POA: Diagnosis not present

## 2021-07-06 DIAGNOSIS — F329 Major depressive disorder, single episode, unspecified: Secondary | ICD-10-CM | POA: Diagnosis not present

## 2021-07-06 NOTE — Progress Notes (Deleted)
NEUROLOGY FOLLOW UP OFFICE NOTE  KENIESHA ADDERLY [id number]  Assessment/Plan:   Gait instability with frequent falls.  May still be skeletal (related to her scoliosis and leg length discrepancy).  However, with such a significant decline, I would evaluate for a primary neurologic etiology.  Consider cerebrovascular disease vs myelopathy such as due to spinal stenosis.  She does not exhibit any signs of polyneuropathy, underlying neurodegenerative disease and unlikely to be due to lumbar spinal stenosis.  Lower extremity strength intact.     Since she does exhibit hyperreflexia, will evaluate for cervical myelopathy due to spinal stenosis.  If that is unremarkable, then would check MRI of brain.     Subjective:  QUINNLEY COLASURDO is a 76 year old right-handed female with hypothyroidism, HLD, arthritis, anxiety/depression and Bipolar disorder who follows up for gait instability and frequent falls.  MRI of brain and cervical spine personally reviewed.  UPDATE: MRI of cervical spine on 04/11/2021 revealed multilevel degenerative changes mild mild canal stenosis at C3-4, moderate left and mild right foraminal stenosis at C5-6 and mild bilateral foraminal stenosis at C3-4 and C4-5 but no cord impingement or other cord abnormalities.  MRI of brain without contrast on 05/14/2021 showed mild to moderate chronic small vessel ischemic changes but no evidence of infarct, hemorrhage, mass or hydrocephalus.     HISTORY: She has leg length discrepancy related to both scoliosis and remote femoral neck fracture status post pinning, for which she wears a lift in her left shoe.  Due to this, she has always felt off balance when she would turn to the left.  However, she has had significant decline in gait stability over the past year.  When she walks, she will fall either forward or back.  She has been averaging one fall a month.  No associated dizziness, leg weakness, or numbness in the feet.  No lumbosacral  radicular pain.  Denies double vision.  She used to walk the dog three times a day and now she has to use a cane.  Since she has become more sedentary, she has become weaker.  If she falls, she now has a difficult time getting up.  X-ray of the left hip on 10/27/2020 personally reviewed showed subtle lucency over the left femoral neck likely related to previous fracture/surgery.   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 3 times a day PRN     amphetamine-dextroamphetamine (ADDERALL) 10 MG tablet Take 10 mg by mouth 2 (two) times daily.     atorvastatin (LIPITOR) 20 MG tablet Take 20 mg by mouth daily.     Cholecalciferol (VITAMIN D3) 10000 UNITS capsule Take 10,000 Units by mouth daily.     citalopram (CELEXA) 40 MG tablet Take by mouth.     conjugated estrogens (PREMARIN) vaginal cream Place 1 gram vaginally at bedtime 2 times per week. 42.5 g 1  cyclobenzaprine (FLEXERIL) 10 MG tablet Take 10 mg by mouth 3 (three) times daily as needed.     fluticasone (FLONASE) 50 MCG/ACT nasal spray Place 2 sprays into the nose every morning.     Glucosamine-Chondroitin 1500-1200 MG/30ML LIQD Take by mouth daily.     levothyroxine (SYNTHROID, LEVOTHROID) 88 MCG tablet Take 88 mcg by mouth every morning.     meloxicam (MOBIC) 15 MG tablet TAKE 1 TABLET ONCE A DAY ORALLY     simvastatin (ZOCOR) 20 MG tablet Take 20 mg by mouth every morning. (Patient not taking: Reported on  03/24/2021)     tiZANidine (ZANAFLEX) 2 MG tablet Take 2 mg by mouth 3 (three) times daily as needed.     traZODone (DESYREL) 50 MG tablet Take 50 mg by mouth as needed.  (Patient not taking: Reported on 03/24/2021)     No current facility-administered medications on file prior to visit.    ALLERGIES: Allergies  Allergen Reactions   Darvocet [Propoxyphene N-Acetaminophen] Hives   Estradiol Other (See Comments)   Other Hives   Propoxyphene Other (See Comments)   Tetanus Antitoxin Swelling   Tetanus-Diphtheria Toxoids Td Other (See Comments)   Lidocaine Palpitations and Other (See Comments)   Lidocaine-Epinephrine Palpitations    FAMILY HISTORY: Family History  Problem Relation Age of Onset   Breast cancer Mother    Cancer Sister        1/2 sister Lymphoma      Objective:  *** General: No acute distress.  Patient appears well-groomed.   Head:  Normocephalic/atraumatic Eyes:  Fundi examined but not visualized Neck: supple, no paraspinal tenderness, full range of motion Heart:  Regular rate and rhythm Lungs:  Clear to auscultation bilaterally Back: No paraspinal tenderness Neurological Exam: alert and oriented to person, place, and time.  Speech fluent and not dysarthric, language intact.  CN II-XII intact. Bulk and tone normal, muscle strength 5/5 throughout.  Sensation to light touch intact.  Deep tendon reflexes 2+ throughout, toes downgoing.  Finger to nose testing intact.  Gait normal, Romberg negative.   Shon Millet, DO  CC: ***

## 2021-07-07 ENCOUNTER — Ambulatory Visit: Payer: PPO | Admitting: Neurology

## 2021-07-19 DIAGNOSIS — M9901 Segmental and somatic dysfunction of cervical region: Secondary | ICD-10-CM | POA: Diagnosis not present

## 2021-07-19 DIAGNOSIS — M9903 Segmental and somatic dysfunction of lumbar region: Secondary | ICD-10-CM | POA: Diagnosis not present

## 2021-07-19 DIAGNOSIS — M9904 Segmental and somatic dysfunction of sacral region: Secondary | ICD-10-CM | POA: Diagnosis not present

## 2021-07-19 DIAGNOSIS — M5030 Other cervical disc degeneration, unspecified cervical region: Secondary | ICD-10-CM | POA: Diagnosis not present

## 2021-07-19 DIAGNOSIS — M9902 Segmental and somatic dysfunction of thoracic region: Secondary | ICD-10-CM | POA: Diagnosis not present

## 2021-07-19 DIAGNOSIS — M5136 Other intervertebral disc degeneration, lumbar region: Secondary | ICD-10-CM | POA: Diagnosis not present

## 2021-07-21 DIAGNOSIS — M9904 Segmental and somatic dysfunction of sacral region: Secondary | ICD-10-CM | POA: Diagnosis not present

## 2021-07-21 DIAGNOSIS — M9901 Segmental and somatic dysfunction of cervical region: Secondary | ICD-10-CM | POA: Diagnosis not present

## 2021-07-21 DIAGNOSIS — M5136 Other intervertebral disc degeneration, lumbar region: Secondary | ICD-10-CM | POA: Diagnosis not present

## 2021-07-21 DIAGNOSIS — M9903 Segmental and somatic dysfunction of lumbar region: Secondary | ICD-10-CM | POA: Diagnosis not present

## 2021-07-21 DIAGNOSIS — M9902 Segmental and somatic dysfunction of thoracic region: Secondary | ICD-10-CM | POA: Diagnosis not present

## 2021-07-21 DIAGNOSIS — M5030 Other cervical disc degeneration, unspecified cervical region: Secondary | ICD-10-CM | POA: Diagnosis not present

## 2021-07-26 DIAGNOSIS — M9904 Segmental and somatic dysfunction of sacral region: Secondary | ICD-10-CM | POA: Diagnosis not present

## 2021-07-26 DIAGNOSIS — M9901 Segmental and somatic dysfunction of cervical region: Secondary | ICD-10-CM | POA: Diagnosis not present

## 2021-07-26 DIAGNOSIS — M9902 Segmental and somatic dysfunction of thoracic region: Secondary | ICD-10-CM | POA: Diagnosis not present

## 2021-07-26 DIAGNOSIS — M5030 Other cervical disc degeneration, unspecified cervical region: Secondary | ICD-10-CM | POA: Diagnosis not present

## 2021-07-26 DIAGNOSIS — M9903 Segmental and somatic dysfunction of lumbar region: Secondary | ICD-10-CM | POA: Diagnosis not present

## 2021-07-26 DIAGNOSIS — M5136 Other intervertebral disc degeneration, lumbar region: Secondary | ICD-10-CM | POA: Diagnosis not present

## 2021-08-02 DIAGNOSIS — M9901 Segmental and somatic dysfunction of cervical region: Secondary | ICD-10-CM | POA: Diagnosis not present

## 2021-08-02 DIAGNOSIS — M9902 Segmental and somatic dysfunction of thoracic region: Secondary | ICD-10-CM | POA: Diagnosis not present

## 2021-08-02 DIAGNOSIS — M5030 Other cervical disc degeneration, unspecified cervical region: Secondary | ICD-10-CM | POA: Diagnosis not present

## 2021-08-02 DIAGNOSIS — M5136 Other intervertebral disc degeneration, lumbar region: Secondary | ICD-10-CM | POA: Diagnosis not present

## 2021-08-02 DIAGNOSIS — M9903 Segmental and somatic dysfunction of lumbar region: Secondary | ICD-10-CM | POA: Diagnosis not present

## 2021-08-02 DIAGNOSIS — M9904 Segmental and somatic dysfunction of sacral region: Secondary | ICD-10-CM | POA: Diagnosis not present

## 2021-08-15 DIAGNOSIS — M2170 Unequal limb length (acquired), unspecified site: Secondary | ICD-10-CM | POA: Diagnosis not present

## 2021-08-15 DIAGNOSIS — M25552 Pain in left hip: Secondary | ICD-10-CM | POA: Diagnosis not present

## 2021-08-15 DIAGNOSIS — Z9181 History of falling: Secondary | ICD-10-CM | POA: Diagnosis not present

## 2021-08-16 ENCOUNTER — Ambulatory Visit: Payer: PPO | Admitting: Neurology

## 2021-09-05 DIAGNOSIS — E7800 Pure hypercholesterolemia, unspecified: Secondary | ICD-10-CM | POA: Diagnosis not present

## 2021-09-05 DIAGNOSIS — F329 Major depressive disorder, single episode, unspecified: Secondary | ICD-10-CM | POA: Diagnosis not present

## 2021-09-05 DIAGNOSIS — E039 Hypothyroidism, unspecified: Secondary | ICD-10-CM | POA: Diagnosis not present

## 2021-09-05 DIAGNOSIS — M810 Age-related osteoporosis without current pathological fracture: Secondary | ICD-10-CM | POA: Diagnosis not present

## 2021-10-19 DIAGNOSIS — F331 Major depressive disorder, recurrent, moderate: Secondary | ICD-10-CM | POA: Diagnosis not present

## 2021-10-19 DIAGNOSIS — F902 Attention-deficit hyperactivity disorder, combined type: Secondary | ICD-10-CM | POA: Diagnosis not present

## 2021-10-30 DIAGNOSIS — M79672 Pain in left foot: Secondary | ICD-10-CM | POA: Diagnosis not present

## 2022-01-03 DIAGNOSIS — M25552 Pain in left hip: Secondary | ICD-10-CM | POA: Diagnosis not present

## 2022-01-08 DIAGNOSIS — M6281 Muscle weakness (generalized): Secondary | ICD-10-CM | POA: Diagnosis not present

## 2022-03-11 ENCOUNTER — Encounter (HOSPITAL_COMMUNITY): Payer: Self-pay | Admitting: Emergency Medicine

## 2022-03-11 ENCOUNTER — Other Ambulatory Visit: Payer: Self-pay

## 2022-03-11 ENCOUNTER — Emergency Department (HOSPITAL_COMMUNITY)
Admission: EM | Admit: 2022-03-11 | Discharge: 2022-03-12 | Disposition: A | Discharge status: Home / Self Care | Payer: PPO | Attending: Emergency Medicine | Admitting: Emergency Medicine

## 2022-03-11 DIAGNOSIS — S61051A Open bite of right thumb without damage to nail, initial encounter: Secondary | ICD-10-CM | POA: Insufficient documentation

## 2022-03-11 DIAGNOSIS — W540XXA Bitten by dog, initial encounter: Secondary | ICD-10-CM | POA: Diagnosis not present

## 2022-03-11 NOTE — ED Triage Notes (Signed)
Pt was bitten by her dog three days ago.  Pt indicated that the dog was vaccinated.  Today her thumb is now swollen.

## 2022-03-12 MED ORDER — CHLORHEXIDINE GLUCONATE 4 % EX LIQD: CUTANEOUS | 0 refills | Status: AC

## 2022-03-12 MED ORDER — AMOXICILLIN-POT CLAVULANATE 875-125 MG PO TABS
1.0000 | ORAL_TABLET | Freq: Once | ORAL | Status: AC
  Administered 2022-03-12: 1 via ORAL
  Filled 2022-03-12: qty 1

## 2022-03-12 MED ORDER — AMOXICILLIN-POT CLAVULANATE 875-125 MG PO TABS: 1.0000 | ORAL_TABLET | Freq: Two times a day (BID) | ORAL | 0 refills | Status: AC

## 2022-03-12 NOTE — ED Provider Notes (Signed)
[facility] Emergency Department Provider Note MRN:  [id number]  Arrival date & time: 03/12/22     Chief Complaint   Animal Bite   History of Present Illness   [name] is a 77 y.o. year-old female presents to the ED with chief complaint of dog bite.  She was bitten about 3 days ago by her dog (current on immunizations).  She noticed some swelling and redness at the tip of her right thumb, but doesn't have pain.  No pain with movement or flexion.  No discharge.  History provided by patient.   Review of Systems  Pertinent positive and negative review of systems noted in HPI.    Physical Exam   Vitals:   03/11/22 2311  BP: 92/65  Pulse: 74  Resp: 18  Temp: 98.2 F (36.8 C)  SpO2: 100%    CONSTITUTIONAL:  well-appearing, NAD NEURO:  Alert and oriented x 3, CN 3-12 grossly intact EYES:  eyes equal and reactive ENT/NECK:  Supple, no stridor  CARDIO:  normal rate, appears well-perfused  PULM:  No respiratory distress,  GI/GU:  non-distended,  MSK/SPINE:  No gross deformities, no edema, moves all extremities, no pain with flexion or extension SKIN:  mild swelling to right thumb pad, without induration or tenderness, healing, non-draining puncture wound just proximal to the IP joint as pictured    *Additional and/or pertinent findings included in MDM below  Diagnostic and Interventional Summary    EKG Interpretation  Date/Time:    Ventricular Rate:    PR Interval:    QRS Duration:   QT Interval:    QTC Calculation:   R Axis:     Text Interpretation:         Labs Reviewed - No data to display  No orders to display    Medications  amoxicillin-clavulanate (AUGMENTIN) 875-125 MG per tablet 1 tablet (has no administration in time range)     Procedures  /  Critical Care Procedures  ED Course and Medical Decision Making  I have reviewed the triage vital signs, the nursing notes, and pertinent available records from the  EMR.  Social Determinants Affecting Complexity of Care: Patient has no clinically significant social determinants affecting this chief complaint..   ED Course:    Medical Decision Making Patient with dog bite to right thumb.  No signs of flexor tenosynovitis.  Not tight and tender as I'd expect for felon.  Will treat with immobilization and augmentin.  Return precautions given.  Risk OTC drugs. Prescription drug management.     Consultants: No consultations were needed in caring for this patient.   Treatment and Plan: Emergency department workup does not suggest an emergent condition requiring admission or immediate intervention beyond  what has been performed at this time. The patient is safe for discharge and has  been instructed to return immediately for worsening symptoms, change in  symptoms or any other concerns    Final Clinical Impressions(s) / ED Diagnoses     ICD-10-CM   1. Dog bite, initial encounter  W54.0XXA       ED Discharge Orders          Ordered    amoxicillin-clavulanate (AUGMENTIN) 875-125 MG tablet  Every 12 hours        03/12/22 0030    chlorhexidine (HIBICLENS) 4 % external liquid        03/12/22 0030              Discharge Instructions Discussed  with and Provided to Patient:    Discharge Instructions      Return for significantly worsening swelling, pain, or fever.  You will likely have slightly worsening symptoms for the next 24-48 hours as the antibiotic starts working, but if it significantly worsens, you should return to the ER.  Wear the splint as often as possible for the next few days until the swelling and redness is gone.  Try to limit housework.  Plenty of R&R is advised!      Montine Circle, PA-C 03/12/22 0037    Orpah Greek, MD 03/13/22 [phone]

## 2022-03-12 NOTE — Discharge Instructions (Addendum)
Return for significantly worsening swelling, pain, or fever.  You will likely have slightly worsening symptoms for the next 24-48 hours as the antibiotic starts working, but if it significantly worsens, you should return to the ER.  Wear the splint as often as possible for the next few days until the swelling and redness is gone.  Try to limit housework.  Plenty of R&R is advised!

## 2023-03-30 DIAGNOSIS — F331 Major depressive disorder, recurrent, moderate: Secondary | ICD-10-CM | POA: Diagnosis not present

## 2023-03-30 DIAGNOSIS — F3181 Bipolar II disorder: Secondary | ICD-10-CM | POA: Diagnosis not present

## 2023-03-30 DIAGNOSIS — F319 Bipolar disorder, unspecified: Secondary | ICD-10-CM | POA: Diagnosis not present

## 2023-03-30 DIAGNOSIS — F902 Attention-deficit hyperactivity disorder, combined type: Secondary | ICD-10-CM | POA: Diagnosis not present

## 2023-04-05 ENCOUNTER — Encounter (HOSPITAL_COMMUNITY): Payer: Self-pay | Admitting: Psychiatry

## 2023-04-05 ENCOUNTER — Emergency Department (HOSPITAL_COMMUNITY)
Admission: EM | Admit: 2023-04-05 | Discharge: 2023-04-08 | Disposition: A | Discharge status: Home / Self Care | Attending: Emergency Medicine | Admitting: Emergency Medicine

## 2023-04-05 DIAGNOSIS — R4589 Other symptoms and signs involving emotional state: Secondary | ICD-10-CM | POA: Diagnosis not present

## 2023-04-05 DIAGNOSIS — R45851 Suicidal ideations: Secondary | ICD-10-CM

## 2023-04-05 DIAGNOSIS — Z79899 Other long term (current) drug therapy: Secondary | ICD-10-CM | POA: Insufficient documentation

## 2023-04-05 DIAGNOSIS — K122 Cellulitis and abscess of mouth: Secondary | ICD-10-CM | POA: Diagnosis not present

## 2023-04-05 DIAGNOSIS — F3181 Bipolar II disorder: Secondary | ICD-10-CM

## 2023-04-05 DIAGNOSIS — F3130 Bipolar disorder, current episode depressed, mild or moderate severity, unspecified: Secondary | ICD-10-CM | POA: Diagnosis not present

## 2023-04-05 DIAGNOSIS — R221 Localized swelling, mass and lump, neck: Secondary | ICD-10-CM | POA: Diagnosis not present

## 2023-04-05 DIAGNOSIS — E039 Hypothyroidism, unspecified: Secondary | ICD-10-CM | POA: Insufficient documentation

## 2023-04-05 DIAGNOSIS — F29 Unspecified psychosis not due to a substance or known physiological condition: Secondary | ICD-10-CM | POA: Insufficient documentation

## 2023-04-05 DIAGNOSIS — R9431 Abnormal electrocardiogram [ECG] [EKG]: Secondary | ICD-10-CM | POA: Diagnosis not present

## 2023-04-05 DIAGNOSIS — F319 Bipolar disorder, unspecified: Secondary | ICD-10-CM | POA: Diagnosis not present

## 2023-04-05 LAB — COMPREHENSIVE METABOLIC PANEL
ALT: 21 U/L (ref 0–44)
AST: 16 U/L (ref 15–41)
Albumin: 3.8 g/dL (ref 3.5–5.0)
Alkaline Phosphatase: 78 U/L (ref 38–126)
Anion gap: 8 (ref 5–15)
BUN: 13 mg/dL (ref 8–23)
CO2: 25 mmol/L (ref 22–32)
Calcium: 9.5 mg/dL (ref 8.9–10.3)
Chloride: 103 mmol/L (ref 98–111)
Creatinine, Ser: 0.75 mg/dL (ref 0.44–1.00)
GFR, Estimated: >60 mL/min (ref >60)
Glucose, Bld: 94 mg/dL (ref 70–99)
Potassium: 4.1 mmol/L (ref 3.5–5.1)
Sodium: 136 mmol/L (ref 135–145)
Total Bilirubin: 0.4 mg/dL (ref 0.0–1.2)
Total Protein: 7.2 g/dL (ref 6.5–8.1)

## 2023-04-05 LAB — SALICYLATE LEVEL: Salicylate Lvl: <7 mg/dL — ABNORMAL LOW (ref 7.0–30.0)

## 2023-04-05 LAB — CBC
HCT: 41.7 % (ref 36.0–46.0)
Hemoglobin: 12.8 g/dL (ref 12.0–15.0)
MCH: 28.5 pg (ref 26.0–34.0)
MCHC: 30.7 g/dL (ref 30.0–36.0)
MCV: 92.9 fL (ref 80.0–100.0)
Platelets: 319 10*3/uL (ref 150–400)
RBC: 4.49 MIL/uL (ref 3.87–5.11)
RDW: 13.8 % (ref 11.5–15.5)
WBC: 7.5 10*3/uL (ref 4.0–10.5)
nRBC: 0 % (ref 0.0–0.2)

## 2023-04-05 LAB — ETHANOL: Alcohol, Ethyl (B): <10 mg/dL (ref <10)

## 2023-04-05 LAB — ACETAMINOPHEN LEVEL: Acetaminophen (Tylenol), Serum: <10 ug/mL — ABNORMAL LOW (ref 10–30)

## 2023-04-05 MED ORDER — DIPHENHYDRAMINE HCL 25 MG PO CAPS
50.0000 mg | ORAL_CAPSULE | Freq: Every evening | ORAL | Status: DC | PRN
  Administered 2023-04-06 – 2023-04-07 (×2): 50 mg via ORAL
  Filled 2023-04-05 (×2): qty 2

## 2023-04-05 NOTE — ED Provider Notes (Signed)
 [place] EMERGENCY DEPARTMENT AT [facility] Provider Note   CSN: [id number] Arrival date & time: 04/05/23  1728     History  Chief Complaint  Patient presents with   Suicidal    [name] is a 78 y.o. female with past medical history of hypothyroidism, bipolar II presents to emergency department via [facility] for evaluation of suicidal thoughts.  She endorses that if she wants to commit suicide it would be through taking legal gas and that she would take her pets with her by killing them as well.  She endorses that her daughter filled out IVC due to SI and not taking her meds appropriately.  Patient endorses that she may have missed 1 dose of her meds but this was due to them not being delivered on time. She has no other medical complaints at this time.  Per IVC paperwork, patient's daughter reports that patient has bene out of her medications for one month. She told her daughter that she wanted to kill herself and her pets by locking themselves in the kitchen and turning on the gas. She also endorses patient has been crying a lot recently.  HPI     Home Medications Prior to Admission medications   Medication Sig Start Date End Date Taking? Authorizing Provider  ALPRAZolam Prudy Feeler) 0.5 MG tablet Take 0.5 mg by mouth every morning. 3 times a day PRN    [provider]  amoxicillin-clavulanate (AUGMENTIN) 875-125 MG tablet Take 1 tablet by mouth every 12 (twelve) hours. 03/12/22   Roxy Horseman, PA-C  amphetamine-dextroamphetamine (ADDERALL) 10 MG tablet Take 10 mg by mouth 2 (two) times daily. 10/15/18   [provider]  atorvastatin (LIPITOR) 20 MG tablet Take 20 mg by mouth daily. 03/31/19   [provider]  chlorhexidine (HIBICLENS) 4 % external liquid Put 2 tablespoons in a small warm bowl and soak your finger morning and evening for 5 days. 03/12/22   Roxy Horseman, PA-C  Cholecalciferol (VITAMIN D3) 10000 UNITS capsule Take 10,000 Units by  mouth daily.    [provider]  citalopram (CELEXA) 40 MG tablet Take by mouth. 04/16/16   [provider]  conjugated estrogens (PREMARIN) vaginal cream Place 1 gram vaginally at bedtime 2 times per week. 07/21/12   Harrington Challenger, NP  cyclobenzaprine (FLEXERIL) 10 MG tablet Take 10 mg by mouth 3 (three) times daily as needed.    [provider]  fluticasone (FLONASE) 50 MCG/ACT nasal spray Place 2 sprays into the nose every morning.    [provider]  Glucosamine-Chondroitin 1500-1200 MG/30ML LIQD Take by mouth daily.    [provider]  levothyroxine (SYNTHROID, LEVOTHROID) 88 MCG tablet Take 88 mcg by mouth every morning.    [provider]  meloxicam (MOBIC) 15 MG tablet TAKE 1 TABLET ONCE A DAY ORALLY 03/30/16   [provider]  simvastatin (ZOCOR) 20 MG tablet Take 20 mg by mouth every morning. Patient not taking: Reported on 03/24/2021    [provider]  tiZANidine (ZANAFLEX) 2 MG tablet Take 2 mg by mouth 3 (three) times daily as needed. 02/19/19   [provider]  traZODone (DESYREL) 50 MG tablet Take 50 mg by mouth as needed.  Patient not taking: Reported on 03/24/2021    [provider]      Allergies    Darvocet [propoxyphene n-acetaminophen], Estradiol, Other, Propoxyphene, Tetanus antitoxin, Tetanus-diphtheria toxoids td, Lidocaine, and Lidocaine-epinephrine (pf)    Review of Systems  Review of Systems  Constitutional:  Negative for chills, fatigue and fever.  Respiratory:  Negative for cough, chest tightness, shortness of breath and wheezing.   Cardiovascular:  Negative for chest pain and palpitations.  Gastrointestinal:  Negative for abdominal pain, constipation, diarrhea, nausea and vomiting.  Neurological:  Negative for dizziness, seizures, weakness, light-headedness, numbness and headaches.  Psychiatric/Behavioral:  Positive for suicidal ideas.     Physical Exam Updated Vital  Signs BP (!) 150/99 (BP Location: Right Arm)   Pulse 99   Temp 98.9 F (37.2 C) (Oral)   Resp 18   SpO2 100%  Physical Exam Vitals and nursing note reviewed.  Constitutional:      General: She is not in acute distress.    Appearance: Normal appearance. She is not ill-appearing or diaphoretic.  HENT:     Head: Normocephalic and atraumatic.  Eyes:     General:        Right eye: No discharge.        Left eye: No discharge.     Conjunctiva/sclera: Conjunctivae normal.  Cardiovascular:     Rate and Rhythm: Normal rate.     Pulses: Normal pulses.  Pulmonary:     Effort: Pulmonary effort is normal. No respiratory distress.     Breath sounds: Normal breath sounds.  Musculoskeletal:     Right lower leg: No edema.     Left lower leg: No edema.  Skin:    General: Skin is warm.     Capillary Refill: Capillary refill takes less than 2 seconds.     Coloration: Skin is not jaundiced or pale.     Comments: No break in skin integrity to suggest self inflicted injury  Neurological:     Mental Status: She is alert and oriented to person, place, and time. Mental status is at baseline.     GCS: GCS eye subscore is 4. GCS verbal subscore is 5. GCS motor subscore is 6.     Comments: Follows commands appropriately  Psychiatric:        Attention and Perception: She does not perceive auditory or visual hallucinations.        Speech: Speech normal.        Behavior: Behavior is cooperative.        Thought Content: Thought content is not paranoid or delusional. Thought content includes suicidal ideation. Thought content does not include homicidal ideation. Thought content includes suicidal plan. Thought content does not include homicidal plan.        Cognition and Memory: Cognition normal.     ED Results / Procedures / Treatments   Labs (all labs ordered are listed, but only abnormal results are displayed) Labs Reviewed  SALICYLATE LEVEL - Abnormal; Notable for the following components:       Result Value   Salicylate Lvl <7.0 (*)    All other components within normal limits  ACETAMINOPHEN LEVEL - Abnormal; Notable for the following components:   Acetaminophen (Tylenol), Serum <10 (*)    All other components within normal limits  COMPREHENSIVE METABOLIC PANEL  ETHANOL  CBC  RAPID URINE DRUG SCREEN, HOSP PERFORMED    EKG None  Radiology No results found.  Procedures Procedures    Medications Ordered in ED Medications - No data to display  ED Course/ Medical Decision Making/ A&P  Medical Decision Making Amount and/or Complexity of Data Reviewed Labs: ordered.   Patient presents to the ED for concern of SI, this involves an extensive number of treatment options, and is a complaint that carries with it a high risk of complications and morbidity.     Co morbidities that complicate the patient evaluation  Bipolar hx   Additional history obtained:  Additional history obtained from Family, Nursing, and Outside Medical Records   External records from outside source obtained and reviewed including  Daughter Triage RN note IVC paperwork filled out by daughter   Lab Tests:  I Ordered, and personally interpreted labs.  The pertinent results include:       Consultations Obtained:  I requested consultation with TTS - pending  Problem List / ED Course:  SI CBC, CMP WNL. UDS pending EKG shows L axis deviation as noted in prior. No QT prolongation No other medical complaints Medically clear for TTS consult IVC by daughter Dispo pending their recommendation   Reevaluation:  After the interventions noted above, I reevaluated the patient and found that they have :stayed the same    Dispostion:  Medically clear  After consideration of the diagnostic results and the patients response to treatment, I feel that the patent would benefit from TTS consult for SI with plan.   Final Clinical Impression(s) / ED  Diagnoses Final diagnoses:  None    Rx / DC Orders ED Discharge Orders     None         Judithann Sheen, PA 04/05/23 2010    Benjiman Core, MD 04/05/23 2355
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
## 2023-04-05 NOTE — BH Assessment (Signed)
 TTS Note:   @ 8:30 PM-Patient was deferred to [facility] Tele[facility] Care Coordinator, Diego Cory, at [phone]. The [facility] Care Coordinator will notify the patient's care team when the [facility] provider is ready to initiate the telehealth assessment. If there are any questions, please reach out to the [facility] Coordinator.

## 2023-04-05 NOTE — ED Notes (Addendum)
 Phone call received from pt daughter Jerolyn Shin phone  [phone] who stated she IVC'd her mother because she refused to go to and arranged evaluation at [facility] with Dr Dellia Cloud Ready. She further stated that a bed had been arranged at [facility] for admission. Daughter states that her mother is seen at [facility].

## 2023-04-05 NOTE — ED Triage Notes (Signed)
 Patient in today via [employer] reporting suicidal thoughts. States that "my mind is messed up". IVCd by daughter not taking meds appropriately. Not taking meds.

## 2023-04-06 LAB — URINALYSIS, ROUTINE W REFLEX MICROSCOPIC
Bilirubin Urine: NEGATIVE
Glucose, UA: NEGATIVE mg/dL
Ketones, ur: NEGATIVE mg/dL
Nitrite: NEGATIVE
Protein, ur: NEGATIVE mg/dL
Specific Gravity, Urine: 1.006 (ref 1.005–1.030)
pH: 6 (ref 5.0–8.0)

## 2023-04-06 LAB — SARS CORONAVIRUS 2 BY RT PCR: SARS Coronavirus 2 by RT PCR: NEGATIVE

## 2023-04-06 MED ORDER — IBUPROFEN 200 MG PO TABS
600.0000 mg | ORAL_TABLET | Freq: Once | ORAL | Status: AC
  Administered 2023-04-06: 600 mg via ORAL
  Filled 2023-04-06: qty 3

## 2023-04-06 MED ORDER — IBUPROFEN 200 MG PO TABS
600.0000 mg | ORAL_TABLET | Freq: Four times a day (QID) | ORAL | Status: DC | PRN
  Administered 2023-04-06 – 2023-04-07 (×2): 600 mg via ORAL
  Filled 2023-04-06 (×4): qty 3

## 2023-04-06 MED ORDER — KETOROLAC TROMETHAMINE 15 MG/ML IJ SOLN
15.0000 mg | Freq: Once | INTRAMUSCULAR | Status: AC
  Administered 2023-04-06: 15 mg via INTRAMUSCULAR
  Filled 2023-04-06: qty 1

## 2023-04-06 NOTE — ED Provider Notes (Signed)
 Emergency Medicine Observation Re-evaluation Note  [name] is a 78 y.o. female, seen on rounds today.  Pt initially presented to the ED for complaints of Suicidal Currently, the patient is resting.  Physical Exam  BP 131/79 (BP Location: Right Arm)   Pulse 66   Temp 98 F (36.7 C) (Oral)   Resp 20   SpO2 97%  Physical Exam General: NAD  ED Course / MDM  EKG:EKG Interpretation Date/Time:  Friday April 05 2023 20:00:11 EST Ventricular Rate:  69 PR Interval:  158 QRS Duration:  86 QT Interval:  388 QTC Calculation: 415 R Axis:   -72  Text Interpretation: Normal sinus rhythm Left axis deviation Pulmonary disease pattern Abnormal ECG Confirmed by Benjiman Core [phone]) on 04/05/2023 11:54:38 PM  I have reviewed the labs performed to date as well as medications administered while in observation.  Recent changes in the last 24 hours include no acute events reported.  Plan  Current plan is for placement.    Wynetta Fines, MD 04/06/23 [phone]

## 2023-04-06 NOTE — ED Notes (Signed)
 Patient resting in bed at this time.

## 2023-04-07 ENCOUNTER — Emergency Department (HOSPITAL_COMMUNITY)

## 2023-04-07 DIAGNOSIS — R221 Localized swelling, mass and lump, neck: Secondary | ICD-10-CM | POA: Diagnosis not present

## 2023-04-07 DIAGNOSIS — K122 Cellulitis and abscess of mouth: Secondary | ICD-10-CM | POA: Diagnosis not present

## 2023-04-07 MED ORDER — LISINOPRIL 10 MG PO TABS
5.0000 mg | ORAL_TABLET | Freq: Every day | ORAL | Status: DC
  Administered 2023-04-07: 5 mg via ORAL
  Filled 2023-04-07: qty 1

## 2023-04-07 MED ORDER — CITALOPRAM HYDROBROMIDE 10 MG PO TABS: 40.0000 mg | ORAL_TABLET | Freq: Every day | ORAL | Status: DC

## 2023-04-07 MED ORDER — AMOXICILLIN-POT CLAVULANATE 875-125 MG PO TABS
1.0000 | ORAL_TABLET | Freq: Two times a day (BID) | ORAL | Status: DC
  Administered 2023-04-07 (×2): 1 via ORAL
  Filled 2023-04-07 (×2): qty 1

## 2023-04-07 MED ORDER — LEVOTHYROXINE SODIUM 100 MCG PO TABS
100.0000 ug | ORAL_TABLET | Freq: Every day | ORAL | Status: DC
Start: 2023-04-07 — End: 2023-04-08
  Administered 2023-04-07 – 2023-04-08 (×2): 100 ug via ORAL
  Filled 2023-04-07 (×2): qty 1

## 2023-04-07 MED ORDER — AMOXICILLIN-POT CLAVULANATE 875-125 MG PO TABS: 1.0000 | ORAL_TABLET | Freq: Once | ORAL | Status: DC

## 2023-04-07 MED ORDER — AMPHETAMINE-DEXTROAMPHETAMINE 10 MG PO TABS
10.0000 mg | ORAL_TABLET | Freq: Two times a day (BID) | ORAL | Status: DC
  Administered 2023-04-07 – 2023-04-08 (×3): 10 mg via ORAL
  Filled 2023-04-07 (×3): qty 1

## 2023-04-07 MED ORDER — ATORVASTATIN CALCIUM 10 MG PO TABS
20.0000 mg | ORAL_TABLET | Freq: Every day | ORAL | Status: DC
Start: 2023-04-08 — End: 2023-04-08

## 2023-04-07 MED ORDER — ALPRAZOLAM 0.5 MG PO TABS
0.5000 mg | ORAL_TABLET | Freq: Three times a day (TID) | ORAL | Status: DC | PRN
  Administered 2023-04-07 (×2): 0.5 mg via ORAL
  Filled 2023-04-07 (×2): qty 1

## 2023-04-07 MED ORDER — IOHEXOL 300 MG/ML  SOLN
100.0000 mL | Freq: Once | INTRAMUSCULAR | Status: AC | PRN
  Administered 2023-04-07: 100 mL via INTRAVENOUS

## 2023-04-07 MED ORDER — LEVOTHYROXINE SODIUM 100 MCG PO TABS: 100.0000 ug | ORAL_TABLET | Freq: Every day | ORAL | Status: DC

## 2023-04-07 MED ORDER — AMPHETAMINE-DEXTROAMPHETAMINE 10 MG PO TABS: 10.0000 mg | ORAL_TABLET | Freq: Two times a day (BID) | ORAL | Status: DC

## 2023-04-07 NOTE — ED Notes (Signed)
 Patient is able to complete own ADLs and ambulate around room. Currently resting in bed.

## 2023-04-07 NOTE — Progress Notes (Addendum)
 Patient has been denied by [facility] due to no appropriate beds available. Patient meets [facility] inpatient criteria per [facility]. Patient has been faxed out to the following facilities:   [facility] [address] [place] [address] [phone]  [facility] [address] [place] [address] [phone]  [facility] [address], [place] [address] [phone]  [facility] Patient Placement [facility], [place] [phone]  [facility] [address] [place] [address] [phone]  [facility] [place] [address] [place], [place] [address] [phone]  [facility] [address] [place] [address] [phone]  [facility] EFAX [address] [place], [place] [phone]  [facility] [address] [place] [address] [phone]  [facility] [address] [place] [address] [phone]  [facility] [place] [address] [phone]  [facility] [address] [place] [address] [phone]  [facility] [address] [place] [address] [phone]  [facility] [address] [place] [address] [phone]  [facility] [address] [place] [address] [phone]  [facility] [address], [place] [address] [phone]   Damita Dunnings, MSW, LCSW-A  11:37 AM 04/07/2023

## 2023-04-07 NOTE — ED Notes (Signed)
 Patient visibly upset about not having Xanax increased advised psych of request.

## 2023-04-07 NOTE — ED Notes (Signed)
 ED Provider at bedside.

## 2023-04-07 NOTE — Progress Notes (Signed)
 [facility] LCSW Progress Note   04/07/2023    1:56 PM  ELOYSE CAUSEY   [phone]   Type of Contact and Topic:  Psychiatric Bed Placement   [facility]     Patient meets inpatient criteria per Caleen Jobs, NP  The attending provider will be Dr. Betti Cruz   Call report to [phone]   Marcelo Baldy, RN @ [facility] ED notified.     Pt scheduled  to arrive at [facility] TODAY.     Damita Dunnings, MSW, LCSW-A  1:58 PM 04/07/2023

## 2023-04-07 NOTE — Consult Note (Signed)
 Patient is accepted to [facility] but unable to travel today due to [place] not available.  Patient will be transported to [facility].  We have resumed all of her home Medications.  Patient denies SI/HI/AVH this afternoon but admitted to feeling depressed and sad.

## 2023-04-07 NOTE — ED Provider Notes (Signed)
 Emergency Medicine Observation Re-evaluation Note  [name] is a 78 y.o. female, seen on rounds today.  Pt initially presented to the ED for complaints of Suicidal Currently, the patient is resting.  Physical Exam  BP 106/83 (BP Location: Right Arm)   Pulse 83   Temp 98.3 F (36.8 C) (Oral)   Resp 18   SpO2 97%  Physical Exam General: NAD  ED Course / MDM  EKG:EKG Interpretation Date/Time:  Friday April 05 2023 20:00:11 EST Ventricular Rate:  69 PR Interval:  158 QRS Duration:  86 QT Interval:  388 QTC Calculation: 415 R Axis:   -72  Text Interpretation: Normal sinus rhythm Left axis deviation Pulmonary disease pattern Abnormal ECG Confirmed by Benjiman Core [phone]) on 04/05/2023 11:54:38 PM  I have reviewed the labs performed to date as well as medications administered while in observation.  Recent changes in the last 24 hours include dx of likely dental infection (Abx started).  Plan  Current plan is for placement.    Wynetta Fines, MD 04/07/23 [phone]

## 2023-04-07 NOTE — ED Notes (Signed)
 Message left for sheriff transport to [facility]

## 2023-04-07 NOTE — ED Notes (Signed)
 Pt requesting her home meds and pain meds.  MD notified and also made aware of swelling to left face that patient states appeared in last 24 hours since being in hospital.  States had dental abscess with abx approx 1 month ago that resolved but feels similar again.

## 2023-04-07 NOTE — Progress Notes (Signed)
 CSW spoke with the Intake Coordinator at [facility] regarding the acceptance of this patient. Per Sunny Schlein, the patient is a patient at an OPT with a provider employed by [employer] who has agreed to accept the patient for admissions.    Damita Dunnings, MSW, LCSW-A  1:55 PM 04/07/2023

## 2023-04-07 NOTE — ED Provider Notes (Signed)
 Called to the back to assess patient for facial swelling. On exam, concern for possible periodontal abscess.  CT scan obtained and confirmed.  This is not unable for drainage in the emergency department.  No evidence of Ludwig angina.  Will require oral antibiotics which were ordered.  Will require dentistry follow-up for definitive management.  Home psych meds ordered.    Nira Conn, MD 04/07/23 [phone]

## 2023-04-07 NOTE — ED Notes (Signed)
 Updated daughter Shanda Bumps on transport to [facility] possible today if not early morning. Verbalized understanding of treatment plan.

## 2023-04-08 DIAGNOSIS — Z91148 Patient's other noncompliance with medication regimen for other reason: Secondary | ICD-10-CM | POA: Diagnosis not present

## 2023-04-08 DIAGNOSIS — I10 Essential (primary) hypertension: Secondary | ICD-10-CM | POA: Diagnosis not present

## 2023-04-08 DIAGNOSIS — F909 Attention-deficit hyperactivity disorder, unspecified type: Secondary | ICD-10-CM | POA: Diagnosis not present

## 2023-04-08 DIAGNOSIS — F3181 Bipolar II disorder: Secondary | ICD-10-CM | POA: Diagnosis not present

## 2023-04-08 DIAGNOSIS — F4310 Post-traumatic stress disorder, unspecified: Secondary | ICD-10-CM | POA: Diagnosis not present

## 2023-04-08 DIAGNOSIS — Z62810 Personal history of physical and sexual abuse in childhood: Secondary | ICD-10-CM | POA: Diagnosis not present

## 2023-04-08 DIAGNOSIS — E785 Hyperlipidemia, unspecified: Secondary | ICD-10-CM | POA: Diagnosis not present

## 2023-04-08 DIAGNOSIS — R4589 Other symptoms and signs involving emotional state: Secondary | ICD-10-CM | POA: Diagnosis not present

## 2023-04-08 DIAGNOSIS — E039 Hypothyroidism, unspecified: Secondary | ICD-10-CM | POA: Diagnosis not present

## 2023-04-08 DIAGNOSIS — R45851 Suicidal ideations: Secondary | ICD-10-CM | POA: Diagnosis not present

## 2023-04-08 NOTE — ED Notes (Signed)
 Report called to Audiological scientist at [facility]

## 2023-04-08 NOTE — ED Notes (Signed)
 Attempted to call report to [facility] left a message for them to call back.

## 2023-04-18 DIAGNOSIS — F331 Major depressive disorder, recurrent, moderate: Secondary | ICD-10-CM | POA: Diagnosis not present

## 2023-04-18 DIAGNOSIS — F902 Attention-deficit hyperactivity disorder, combined type: Secondary | ICD-10-CM | POA: Diagnosis not present

## 2023-04-19 DIAGNOSIS — L819 Disorder of pigmentation, unspecified: Secondary | ICD-10-CM | POA: Diagnosis not present

## 2023-04-19 DIAGNOSIS — Z87892 Personal history of anaphylaxis: Secondary | ICD-10-CM | POA: Diagnosis not present

## 2023-04-19 DIAGNOSIS — R413 Other amnesia: Secondary | ICD-10-CM | POA: Diagnosis not present

## 2023-04-19 DIAGNOSIS — E559 Vitamin D deficiency, unspecified: Secondary | ICD-10-CM | POA: Diagnosis not present

## 2023-04-19 DIAGNOSIS — F319 Bipolar disorder, unspecified: Secondary | ICD-10-CM | POA: Diagnosis not present

## 2023-04-22 ENCOUNTER — Other Ambulatory Visit: Payer: Self-pay | Admitting: Family Medicine

## 2023-04-22 DIAGNOSIS — R413 Other amnesia: Secondary | ICD-10-CM

## 2023-05-23 DIAGNOSIS — F902 Attention-deficit hyperactivity disorder, combined type: Secondary | ICD-10-CM | POA: Diagnosis not present

## 2023-05-23 DIAGNOSIS — F3181 Bipolar II disorder: Secondary | ICD-10-CM | POA: Diagnosis not present

## 2023-07-30 DIAGNOSIS — R4189 Other symptoms and signs involving cognitive functions and awareness: Secondary | ICD-10-CM | POA: Diagnosis not present

## 2023-07-31 ENCOUNTER — Other Ambulatory Visit: Payer: Self-pay | Admitting: Family Medicine

## 2023-07-31 DIAGNOSIS — R413 Other amnesia: Secondary | ICD-10-CM

## 2024-07-08 ENCOUNTER — Ambulatory Visit: Admitting: Neurology
